# Patient Record
Sex: Female | Born: 1950 | Race: White | Hispanic: No | Marital: Married | Smoking: Never smoker
Health system: Southern US, Community
[De-identification: ages and names within clinical notes are randomized; demographics above are authoritative.]

## PROBLEM LIST (undated history)

## (undated) DIAGNOSIS — I10 Essential (primary) hypertension: Secondary | ICD-10-CM

## (undated) DIAGNOSIS — F32A Depression, unspecified: Secondary | ICD-10-CM

## (undated) DIAGNOSIS — E785 Hyperlipidemia, unspecified: Secondary | ICD-10-CM

## (undated) DIAGNOSIS — J302 Other seasonal allergic rhinitis: Secondary | ICD-10-CM

## (undated) DIAGNOSIS — F329 Major depressive disorder, single episode, unspecified: Secondary | ICD-10-CM

## (undated) DIAGNOSIS — T7840XA Allergy, unspecified, initial encounter: Secondary | ICD-10-CM

## (undated) HISTORY — PX: CHOLECYSTECTOMY: SHX55

## (undated) HISTORY — DX: Allergy, unspecified, initial encounter: T78.40XA

## (undated) HISTORY — PX: VAGINAL HYSTERECTOMY: SUR661

## (undated) HISTORY — PX: RIGHT HEART CATH: CATH118263

## (undated) HISTORY — DX: Depression, unspecified: F32.A

## (undated) HISTORY — PX: AUGMENTATION MAMMAPLASTY: SUR837

## (undated) HISTORY — DX: Major depressive disorder, single episode, unspecified: F32.9

## (undated) HISTORY — DX: Essential (primary) hypertension: I10

## (undated) HISTORY — DX: Other seasonal allergic rhinitis: J30.2

## (undated) HISTORY — DX: Hyperlipidemia, unspecified: E78.5

## (undated) HISTORY — PX: BREAST ENHANCEMENT SURGERY: SHX7

---

## 2014-04-30 ENCOUNTER — Encounter: Payer: Self-pay | Admitting: Internal Medicine

## 2014-04-30 ENCOUNTER — Other Ambulatory Visit: Payer: Self-pay

## 2014-04-30 DIAGNOSIS — Z1231 Encounter for screening mammogram for malignant neoplasm of breast: Secondary | ICD-10-CM

## 2014-05-10 ENCOUNTER — Ambulatory Visit
Admission: RE | Admit: 2014-05-10 | Discharge: 2014-05-10 | Disposition: A | Discharge status: Home / Self Care | Payer: BC Managed Care – PPO | Source: Ambulatory Visit

## 2014-05-10 DIAGNOSIS — Z1231 Encounter for screening mammogram for malignant neoplasm of breast: Secondary | ICD-10-CM

## 2014-05-27 ENCOUNTER — Ambulatory Visit (AMBULATORY_SURGERY_CENTER): Payer: Self-pay | Admitting: *Deleted

## 2014-05-27 VITALS — Ht 68.0 in | Wt 179.0 lb

## 2014-05-27 DIAGNOSIS — Z1211 Encounter for screening for malignant neoplasm of colon: Secondary | ICD-10-CM

## 2014-05-27 MED ORDER — MOVIPREP 100 G PO SOLR: 1.0000 | Freq: Once | ORAL | Status: DC

## 2014-05-27 NOTE — Progress Notes (Signed)
Denies allergies to eggs or soy products. Denies complications with sedation or anesthesia. Denies O2 use. Denies use of diet or weight loss medications.  Emmi instructions given for colonoscopy.  

## 2014-06-03 ENCOUNTER — Encounter: Payer: Self-pay | Admitting: Internal Medicine

## 2014-06-12 ENCOUNTER — Encounter: Payer: Self-pay | Admitting: Internal Medicine

## 2014-06-27 ENCOUNTER — Ambulatory Visit (AMBULATORY_SURGERY_CENTER): Payer: BC Managed Care – PPO | Admitting: Internal Medicine

## 2014-06-27 ENCOUNTER — Encounter: Payer: Self-pay | Admitting: Internal Medicine

## 2014-06-27 VITALS — BP 130/76 | HR 59 | Temp 97.1°F | Resp 16 | Ht 68.0 in | Wt 179.0 lb

## 2014-06-27 DIAGNOSIS — Z1211 Encounter for screening for malignant neoplasm of colon: Secondary | ICD-10-CM

## 2014-06-27 DIAGNOSIS — D126 Benign neoplasm of colon, unspecified: Secondary | ICD-10-CM

## 2014-06-27 MED ORDER — SODIUM CHLORIDE 0.9 % IV SOLN: 500.0000 mL | INTRAVENOUS | Status: DC

## 2014-06-27 NOTE — Patient Instructions (Signed)
YOU HAD AN ENDOSCOPIC PROCEDURE TODAY AT THE [facility]: Refer to the procedure report that was given to you for any specific questions about what was found during the examination.  If the procedure report does not answer your questions, please call your gastroenterologist to clarify.  If you requested that your care partner not be given the details of your procedure findings, then the procedure report has been included in a sealed envelope for you to review at your convenience later.  YOU SHOULD EXPECT: Some feelings of bloating in the abdomen. Passage of more gas than usual.  Walking can help get rid of the air that was put into your GI tract during the procedure and reduce the bloating. If you had a lower endoscopy (such as a colonoscopy or flexible sigmoidoscopy) you may notice spotting of blood in your stool or on the toilet paper. If you underwent a bowel prep for your procedure, then you may not have a normal bowel movement for a few days.  DIET: Your first meal following the procedure should be a light meal and then it is ok to progress to your normal diet.  A half-sandwich or bowl of soup is an example of a good first meal.  Heavy or fried foods are harder to digest and may make you feel nauseous or bloated.  Likewise meals heavy in dairy and vegetables can cause extra gas to form and this can also increase the bloating.  Drink plenty of fluids but you should avoid alcoholic beverages for 24 hours.  ACTIVITY: Your care partner should take you home directly after the procedure.  You should plan to take it easy, moving slowly for the rest of the day.  You can resume normal activity the day after the procedure however you should NOT DRIVE or use heavy machinery for 24 hours (because of the sedation medicines used during the test).    SYMPTOMS TO REPORT IMMEDIATELY: A gastroenterologist can be reached at any hour.  During normal business hours, 8:30 AM to 5:00 PM Monday through Friday,  call [phone].  After hours and on weekends, please call the GI answering service at [phone] who will take a message and have the physician on call contact you.   Following lower endoscopy (colonoscopy or flexible sigmoidoscopy):  Excessive amounts of blood in the stool  Significant tenderness or worsening of abdominal pains  Swelling of the abdomen that is new, acute  Fever of 100F or higher    FOLLOW UP: If any biopsies were taken you will be contacted by phone or by letter within the next 1-3 weeks.  Call your gastroenterologist if you have not heard about the biopsies in 3 weeks.  Our staff will call the home number listed on your records the next business day following your procedure to check on you and address any questions or concerns that you may have at that time regarding the information given to you following your procedure. This is a courtesy call and so if there is no answer at the home number and we have not heard from you through the emergency physician on call, we will assume that you have returned to your regular daily activities without incident.  SIGNATURES/CONFIDENTIALITY: You and/or your care partner have signed paperwork which will be entered into your electronic medical record.  These signatures attest to the fact that that the information above on your After Visit Summary has been reviewed and is understood.  Full responsibility of the confidentiality   of this discharge information lies with you and/or your care-partner.  Polyp, diverticulosis, and high fiber diet information given. 

## 2014-06-27 NOTE — Progress Notes (Signed)
A/ox3 pleased with MAC, report to Jane RN 

## 2014-06-27 NOTE — Progress Notes (Signed)
Called to room to assist during endoscopic procedure.  Patient ID and intended procedure confirmed with present staff. Received instructions for my participation in the procedure from the performing physician.  

## 2014-06-27 NOTE — Op Note (Signed)
[place]  [address]. [place], [id number]   COLONOSCOPY PROCEDURE REPORT  PATIENT: Shannon Haney, Shannon Haney  MR#: [id number] BIRTHDATE: [birth year] , 63  yrs. old GENDER: Female ENDOSCOPIST: Jerene Bears, MD REFERRED UM:PNTIR Ardeth Perfect, M.D. PROCEDURE DATE:  06/27/2014 PROCEDURE:   Colonoscopy with cold biopsy polypectomy First Screening Colonoscopy - Avg.  risk and is 50 yrs.  old or older - No.  Prior Negative Screening - Now for repeat screening. 10 or more years since last screening  History of Adenoma - Now for follow-up colonoscopy & has been > or = to 3 yrs.  N/A  Polyps Removed Today? Yes. ASA CLASS:   Class II INDICATIONS:average risk screening and Last colonoscopy performed 10 years ago. MEDICATIONS: MAC sedation, administered by CRNA and propofol (Diprivan) 200mg  IV  DESCRIPTION OF PROCEDURE:   After the risks benefits and alternatives of the procedure were thoroughly explained, informed consent was obtained.  A digital rectal exam revealed a skin tag. The LB [id number]  endoscope was introduced through the anus and advanced to the terminal ileum which was intubated for a short distance. No adverse events experienced.   The quality of the prep was good, using MoviPrep  The instrument was then slowly withdrawn as the colon was fully examined.   COLON FINDINGS: The mucosa appeared normal in the terminal ileum. A sessile polyp measuring 3-4 mm in size was found at the hepatic flexure.  A polypectomy was performed with cold forceps.  The resection was complete and the polyp tissue was completely retrieved.   Mild diverticulosis was noted in the sigmoid colon. Retroflexed views revealed internal hemorrhoids and hypertrophied anal papilla. The time to cecum=3 minutes 11 seconds.  Withdrawal time=10 minutes 37 seconds.  The scope was withdrawn and the procedure completed. COMPLICATIONS: There were no complications.  ENDOSCOPIC IMPRESSION: 1.   Normal  mucosa in the terminal ileum 2.   Sessile polyp measuring 3-4 mm in size was found at the hepatic flexure; polypectomy was performed with cold forceps 3.   Mild diverticulosis was noted in the sigmoid colon  RECOMMENDATIONS: 1.  Await biopsy results 2.  High fiber diet 3.  If the polyp removed today is proven to be an adenomatous (pre-cancerous) polyp, you will need a repeat colonoscopy in 5 years.  Otherwise you should continue to follow colorectal cancer screening guidelines for "routine risk" patients with colonoscopy in 10 years.  You will receive a letter within 1-2 weeks with the results of your biopsy as well as final recommendations.  Please call my office if you have not received a letter after 3 weeks.  eSigned:  Jerene Bears, MD 06/27/2014 9:08 AM     cc: The Patient; Velna Hatchet, M.D.

## 2014-06-28 ENCOUNTER — Telehealth: Payer: Self-pay | Admitting: *Deleted

## 2014-06-28 NOTE — Telephone Encounter (Signed)
  Follow up Call-  Call back number 06/27/2014  Post procedure Call Back phone  # [phone]  Permission to leave phone message Yes     Patient questions:  Do you have a fever, pain , or abdominal swelling? No. Pain Score  0 *  Have you tolerated food without any problems? Yes.    Have you been able to return to your normal activities? Yes.    Do you have any questions about your discharge instructions: Diet   No. Medications  No. Follow up visit  No.  Do you have questions or concerns about your Care? No.  Actions: * If pain score is 4 or above: No action needed, pain <4.

## 2014-07-05 ENCOUNTER — Encounter: Payer: Self-pay | Admitting: Internal Medicine

## 2014-12-05 ENCOUNTER — Other Ambulatory Visit: Payer: Self-pay | Admitting: Ophthalmology

## 2016-11-25 ENCOUNTER — Other Ambulatory Visit: Payer: Self-pay | Admitting: Obstetrics

## 2016-11-25 DIAGNOSIS — R928 Other abnormal and inconclusive findings on diagnostic imaging of breast: Secondary | ICD-10-CM

## 2016-12-03 ENCOUNTER — Ambulatory Visit
Admission: RE | Admit: 2016-12-03 | Discharge: 2016-12-03 | Disposition: A | Discharge status: Home / Self Care | Payer: Medicare Other | Source: Ambulatory Visit | Attending: Obstetrics | Admitting: Obstetrics

## 2016-12-03 DIAGNOSIS — R928 Other abnormal and inconclusive findings on diagnostic imaging of breast: Secondary | ICD-10-CM

## 2017-05-25 ENCOUNTER — Other Ambulatory Visit: Payer: Self-pay | Admitting: Obstetrics

## 2017-05-25 DIAGNOSIS — N6320 Unspecified lump in the left breast, unspecified quadrant: Secondary | ICD-10-CM

## 2017-06-08 ENCOUNTER — Other Ambulatory Visit: Payer: Medicare Other

## 2017-06-09 ENCOUNTER — Ambulatory Visit
Admission: RE | Admit: 2017-06-09 | Discharge: 2017-06-09 | Disposition: A | Discharge status: Home / Self Care | Payer: Medicare Other | Source: Ambulatory Visit | Attending: Obstetrics | Admitting: Obstetrics

## 2017-06-09 ENCOUNTER — Other Ambulatory Visit: Payer: Self-pay | Admitting: Obstetrics

## 2017-06-09 DIAGNOSIS — N6320 Unspecified lump in the left breast, unspecified quadrant: Secondary | ICD-10-CM

## 2017-06-09 DIAGNOSIS — N63 Unspecified lump in unspecified breast: Secondary | ICD-10-CM

## 2017-12-12 ENCOUNTER — Ambulatory Visit
Admission: RE | Admit: 2017-12-12 | Discharge: 2017-12-12 | Disposition: A | Discharge status: Home / Self Care | Payer: Medicare Other | Source: Ambulatory Visit | Attending: Obstetrics | Admitting: Obstetrics

## 2017-12-12 ENCOUNTER — Other Ambulatory Visit: Payer: Self-pay | Admitting: Obstetrics

## 2017-12-12 DIAGNOSIS — N63 Unspecified lump in unspecified breast: Secondary | ICD-10-CM

## 2017-12-12 DIAGNOSIS — N6320 Unspecified lump in the left breast, unspecified quadrant: Secondary | ICD-10-CM

## 2018-06-20 ENCOUNTER — Other Ambulatory Visit: Payer: Self-pay | Admitting: Family Medicine

## 2018-06-20 DIAGNOSIS — Z8249 Family history of ischemic heart disease and other diseases of the circulatory system: Secondary | ICD-10-CM

## 2018-07-26 ENCOUNTER — Ambulatory Visit
Admission: RE | Admit: 2018-07-26 | Discharge: 2018-07-26 | Disposition: A | Discharge status: Home / Self Care | Payer: No Typology Code available for payment source | Source: Ambulatory Visit | Attending: Family Medicine | Admitting: Family Medicine

## 2018-07-26 DIAGNOSIS — Z8249 Family history of ischemic heart disease and other diseases of the circulatory system: Secondary | ICD-10-CM

## 2019-01-01 ENCOUNTER — Observation Stay (HOSPITAL_BASED_OUTPATIENT_CLINIC_OR_DEPARTMENT_OTHER): Payer: Medicare Other

## 2019-01-01 ENCOUNTER — Observation Stay (HOSPITAL_BASED_OUTPATIENT_CLINIC_OR_DEPARTMENT_OTHER)
Admission: EM | Admit: 2019-01-01 | Discharge: 2019-01-02 | Disposition: A | Discharge status: Home / Self Care | Payer: Medicare Other | Attending: Internal Medicine | Admitting: Internal Medicine

## 2019-01-01 ENCOUNTER — Emergency Department (HOSPITAL_BASED_OUTPATIENT_CLINIC_OR_DEPARTMENT_OTHER): Payer: Medicare Other

## 2019-01-01 ENCOUNTER — Other Ambulatory Visit: Payer: Self-pay

## 2019-01-01 ENCOUNTER — Encounter (HOSPITAL_BASED_OUTPATIENT_CLINIC_OR_DEPARTMENT_OTHER): Payer: Self-pay | Admitting: Emergency Medicine

## 2019-01-01 DIAGNOSIS — Z7982 Long term (current) use of aspirin: Secondary | ICD-10-CM | POA: Diagnosis not present

## 2019-01-01 DIAGNOSIS — Z79899 Other long term (current) drug therapy: Secondary | ICD-10-CM | POA: Insufficient documentation

## 2019-01-01 DIAGNOSIS — E785 Hyperlipidemia, unspecified: Secondary | ICD-10-CM | POA: Diagnosis not present

## 2019-01-01 DIAGNOSIS — F329 Major depressive disorder, single episode, unspecified: Secondary | ICD-10-CM | POA: Diagnosis not present

## 2019-01-01 DIAGNOSIS — F411 Generalized anxiety disorder: Secondary | ICD-10-CM | POA: Diagnosis not present

## 2019-01-01 DIAGNOSIS — R9431 Abnormal electrocardiogram [ECG] [EKG]: Secondary | ICD-10-CM

## 2019-01-01 DIAGNOSIS — R61 Generalized hyperhidrosis: Secondary | ICD-10-CM | POA: Diagnosis not present

## 2019-01-01 DIAGNOSIS — R001 Bradycardia, unspecified: Principal | ICD-10-CM | POA: Diagnosis present

## 2019-01-01 DIAGNOSIS — Z882 Allergy status to sulfonamides status: Secondary | ICD-10-CM | POA: Diagnosis not present

## 2019-01-01 DIAGNOSIS — I10 Essential (primary) hypertension: Secondary | ICD-10-CM | POA: Diagnosis present

## 2019-01-01 DIAGNOSIS — R079 Chest pain, unspecified: Secondary | ICD-10-CM | POA: Insufficient documentation

## 2019-01-01 LAB — CBC WITH DIFFERENTIAL/PLATELET
Abs Immature Granulocytes: 0.01 10*3/uL (ref 0.00–0.07)
BASOS ABS: 0 10*3/uL (ref 0.0–0.1)
Basophils Relative: 0 %
EOS PCT: 3 %
Eosinophils Absolute: 0.2 10*3/uL (ref 0.0–0.5)
HEMATOCRIT: 41.7 % (ref 36.0–46.0)
HEMOGLOBIN: 13.9 g/dL (ref 12.0–15.0)
Immature Granulocytes: 0 %
Lymphocytes Relative: 9 %
Lymphs Abs: 0.7 10*3/uL (ref 0.7–4.0)
MCH: 30.3 pg (ref 26.0–34.0)
MCHC: 33.3 g/dL (ref 30.0–36.0)
MCV: 91 fL (ref 80.0–100.0)
Monocytes Absolute: 0.3 10*3/uL (ref 0.1–1.0)
Monocytes Relative: 4 %
NEUTROS PCT: 84 %
NRBC: 0 % (ref 0.0–0.2)
Neutro Abs: 6.6 10*3/uL (ref 1.7–7.7)
Platelets: 205 10*3/uL (ref 150–400)
RBC: 4.58 MIL/uL (ref 3.87–5.11)
RDW: 12.4 % (ref 11.5–15.5)
WBC: 7.8 10*3/uL (ref 4.0–10.5)

## 2019-01-01 LAB — D-DIMER, QUANTITATIVE: D-Dimer, Quant: <0.27 ug/mL-FEU (ref 0.00–0.50)

## 2019-01-01 LAB — COMPREHENSIVE METABOLIC PANEL
ALT: 26 U/L (ref 0–44)
ANION GAP: 10 (ref 5–15)
AST: 23 U/L (ref 15–41)
Albumin: 4.3 g/dL (ref 3.5–5.0)
Alkaline Phosphatase: 61 U/L (ref 38–126)
BUN: 23 mg/dL (ref 8–23)
CHLORIDE: 105 mmol/L (ref 98–111)
CO2: 19 mmol/L — ABNORMAL LOW (ref 22–32)
Calcium: 8.7 mg/dL — ABNORMAL LOW (ref 8.9–10.3)
Creatinine, Ser: 0.74 mg/dL (ref 0.44–1.00)
GFR calc Af Amer: >60 mL/min (ref >60)
Glucose, Bld: 122 mg/dL — ABNORMAL HIGH (ref 70–99)
POTASSIUM: 3.8 mmol/L (ref 3.5–5.1)
Sodium: 134 mmol/L — ABNORMAL LOW (ref 135–145)
Total Bilirubin: 0.6 mg/dL (ref 0.3–1.2)
Total Protein: 7.3 g/dL (ref 6.5–8.1)

## 2019-01-01 LAB — TROPONIN I: Troponin I: <0.03 ng/mL (ref <0.03)

## 2019-01-01 LAB — ECHOCARDIOGRAM COMPLETE
Height: 68 in
WEIGHTICAEL: 2839.52 [oz_av]

## 2019-01-01 LAB — BRAIN NATRIURETIC PEPTIDE: B NATRIURETIC PEPTIDE 5: 29.8 pg/mL (ref 0.0–100.0)

## 2019-01-01 MED ORDER — DIPHENHYDRAMINE HCL 25 MG PO CAPS
25.0000 mg | ORAL_CAPSULE | Freq: Every evening | ORAL | Status: DC | PRN
  Filled 2019-01-01: qty 1

## 2019-01-01 MED ORDER — SIMVASTATIN 20 MG PO TABS
20.0000 mg | ORAL_TABLET | Freq: Every day | ORAL | Status: DC
  Administered 2019-01-01: 20 mg via ORAL
  Filled 2019-01-01: qty 1

## 2019-01-01 MED ORDER — DIPHENHYDRAMINE-APAP (SLEEP) 25-500 MG PO TABS: 1.0000 | ORAL_TABLET | Freq: Every evening | ORAL | Status: DC | PRN

## 2019-01-01 MED ORDER — ENOXAPARIN SODIUM 40 MG/0.4ML ~~LOC~~ SOLN
40.0000 mg | SUBCUTANEOUS | Status: DC
  Administered 2019-01-01: 40 mg via SUBCUTANEOUS
  Filled 2019-01-01: qty 0.4

## 2019-01-01 MED ORDER — NITROGLYCERIN 0.4 MG SL SUBL
0.4000 mg | SUBLINGUAL_TABLET | SUBLINGUAL | Status: DC | PRN
  Administered 2019-01-01: 0.4 mg via SUBLINGUAL
  Filled 2019-01-01: qty 1

## 2019-01-01 MED ORDER — ASPIRIN EC 81 MG PO TBEC
81.0000 mg | DELAYED_RELEASE_TABLET | Freq: Every day | ORAL | Status: DC
  Administered 2019-01-02: 81 mg via ORAL
  Filled 2019-01-01: qty 1

## 2019-01-01 MED ORDER — ACETAMINOPHEN 500 MG PO TABS: 500.0000 mg | ORAL_TABLET | Freq: Every evening | ORAL | Status: DC | PRN

## 2019-01-01 MED ORDER — ACETAMINOPHEN 325 MG PO TABS
650.0000 mg | ORAL_TABLET | Freq: Four times a day (QID) | ORAL | Status: DC | PRN
  Administered 2019-01-01 (×2): 650 mg via ORAL
  Filled 2019-01-01 (×2): qty 2

## 2019-01-01 MED ORDER — ONDANSETRON HCL 4 MG/2ML IJ SOLN: 4.0000 mg | Freq: Four times a day (QID) | INTRAMUSCULAR | Status: DC | PRN

## 2019-01-01 MED ORDER — ASPIRIN 81 MG PO CHEW
324.0000 mg | CHEWABLE_TABLET | Freq: Once | ORAL | Status: AC
  Administered 2019-01-01: 324 mg via ORAL
  Filled 2019-01-01: qty 4

## 2019-01-01 MED ORDER — SODIUM CHLORIDE 0.9 % IV BOLUS
1000.0000 mL | Freq: Once | INTRAVENOUS | Status: AC
  Administered 2019-01-01: 1000 mL via INTRAVENOUS

## 2019-01-01 MED ORDER — ONDANSETRON HCL 4 MG/2ML IJ SOLN
4.0000 mg | Freq: Once | INTRAMUSCULAR | Status: AC
  Administered 2019-01-01: 4 mg via INTRAVENOUS
  Filled 2019-01-01: qty 2

## 2019-01-01 MED ORDER — ESCITALOPRAM OXALATE 10 MG PO TABS
10.0000 mg | ORAL_TABLET | Freq: Every day | ORAL | Status: DC
  Administered 2019-01-01: 10 mg via ORAL
  Filled 2019-01-01 (×2): qty 1

## 2019-01-01 MED ORDER — LISINOPRIL 20 MG PO TABS
20.0000 mg | ORAL_TABLET | Freq: Every day | ORAL | Status: DC
  Administered 2019-01-01: 20 mg via ORAL
  Filled 2019-01-01 (×2): qty 1

## 2019-01-01 MED ORDER — MORPHINE SULFATE (PF) 2 MG/ML IV SOLN: 2.0000 mg | INTRAVENOUS | Status: DC | PRN

## 2019-01-01 NOTE — ED Notes (Signed)
Pt became very diaphoretic, nauseated and pale. HR dropped to 48-50. EDP to bedside for eval.

## 2019-01-01 NOTE — Progress Notes (Signed)
  Echocardiogram 2D Echocardiogram has been performed.  Shannon Haney 01/01/2019, 4:16 PM

## 2019-01-01 NOTE — Progress Notes (Signed)
Admitted from [facility] via EMS.  Patient alert oriented x4. Room air. Denies chest pain, denies dizziness. Vitals stable. Complains of discomfort in right arm and neck. Given tylenol. Oriented to room, family at bedside.

## 2019-01-01 NOTE — H&P (Signed)
History and Physical    Shannon Haney NGE:[id number] DOB: 05-10-1951 DOA: 01/01/2019  PCP: Velna Hatchet, MD  Patient coming from: home   I have personally briefly reviewed patient's old medical records available.   Chief Complaint: 3 brief episodes of nausea, diaphoresis, 1 week of right arm pain.   HPI: Shannon Haney is a 68 y.o. female with medical history significant of hypertension, hyperlipidemia, generalized anxiety disorder presenting to the emergency room today when she had total 3 episodes of transient symptoms including nausea, diaphoresis and dizziness.  According to the patient, she was having dull, mild, pain all over her right arm for about 1 week and was taking some Tylenol.  This was continues with no aggravating or relieving factors.  Today morning at about 4 AM she woke up with shortness of breath, nausea and diaphoresisPatient had another episode at about 630 when she went to emergency room.  The pain symptoms on her right arm were mostly on the right side.  No radiation to the chest, back neck or jaw.  No history of angina. 2 episodes happened at home, she was symptom-free when he went to ER, in the emergency room she had one episode where she was on monitor, she was noted to be bradycardic heart rate as low as 48 on telemetry.  Improved spontaneously in 2 minutes.  IV fluids and Zofran were given.  Bradycardia was reportedly looked like a vagal event when patient was feeling near syncopal and dizzy. Patient denies any fever, cough, wheezing, angina, abdominal pain.  Denies any recent flulike symptoms.  Denies any trauma. ED Course: Hemodynamically stable.  Bradycardic at 48 at the time of event.  2 sets of troponins negative.  Twelve-lead EKG shows nonischemic.  Low voltage EKG with no ST-T wave changes.  She was given aspirin nitroglycerin along with IV fluids and Zofran in the ER.  Review of Systems: As per HPI otherwise 10 point review of systems negative.    Past  Medical History:  Diagnosis Date  . Depression   . Hyperlipidemia   . Hypertension   . Seasonal allergies     Past Surgical History:  Procedure Laterality Date  . BREAST ENHANCEMENT SURGERY    . CHOLECYSTECTOMY    . VAGINAL HYSTERECTOMY       reports that she has never smoked. She has never used smokeless tobacco. She reports current alcohol use of about 5.0 standard drinks of alcohol per week. She reports that she does not use drugs.  Allergies  Allergen Reactions  . Sulfa Antibiotics Rash    Fever, joint aches    Family History  Problem Relation Age of Onset  . Prostate cancer Father   . Colon cancer Neg Hx   . Esophageal cancer Neg Hx   . Rectal cancer Neg Hx   . Stomach cancer Neg Hx      Prior to Admission medications   Medication Sig Start Date End Date Taking? Authorizing Provider  Acetaminophen (TYLENOL EXTRA STRENGTH PO) Take 1,000 mg by mouth every 8 (eight) hours as needed (pain).    Yes [provider]  diphenhydramine-acetaminophen (TYLENOL PM) 25-500 MG TABS Take 1 tablet by mouth at bedtime as needed (sleep).    Yes [provider]  escitalopram (LEXAPRO) 10 MG tablet Take 10 mg by mouth daily.   Yes [provider]  lisinopril (PRINIVIL,ZESTRIL) 20 MG tablet Take 20 mg by mouth daily.    Yes [provider]  simvastatin (ZOCOR) 20 MG tablet  Take 20 mg by mouth daily at 6 PM.    Yes [provider]    Physical Exam: Vitals:   01/01/19 1100 01/01/19 1254 01/01/19 1301 01/01/19 1305  BP: 112/73  131/79   Pulse: 70  72   Resp: 17  17   Temp:    97.6 F (36.4 C)  TempSrc:      SpO2: 96%  98%   Weight:  80.5 kg    Height:  5\' 8"  (1.727 m)      Constitutional: NAD, calm, comfortable Vitals:   01/01/19 1100 01/01/19 1254 01/01/19 1301 01/01/19 1305  BP: 112/73  131/79   Pulse: 70  72   Resp: 17  17   Temp:    97.6 F (36.4 C)  TempSrc:      SpO2: 96%  98%   Weight:  80.5 kg    Height:  5\' 8"  (1.727  m)     Eyes: PERRL, lids and conjunctivae normal ENMT: Mucous membranes are moist. Posterior pharynx clear of any exudate or lesions.Normal dentition.  Neck: normal, supple, no masses, no thyromegaly Respiratory: clear to auscultation bilaterally, no wheezing, no crackles. Normal respiratory effort. No accessory muscle use.  Cardiovascular: Regular rate and rhythm, no murmurs / rubs / gallops. No extremity edema. 2+ pedal pulses. No carotid bruits.  Abdomen: no tenderness, no masses palpated. No hepatosplenomegaly. Bowel sounds positive.  Musculoskeletal: no clubbing / cyanosis. No joint deformity upper and lower extremities. Good ROM, no contractures. Normal muscle tone.  Skin: no rashes, lesions, ulcers. No induration Neurologic: CN 2-12 grossly intact. Sensation intact, DTR normal. Strength 5/5 in all 4.  Psychiatric: Normal judgment and insight. Alert and oriented x 3. Normal mood.   She does not have any reproducible tenderness.  Right arm examination does not reveal any palpable tenderness, joint tenderness or limitation of range of motion.  Labs on Admission: I have personally reviewed following labs and imaging studies  CBC: Recent Labs  Lab 01/01/19 0824  WBC 7.8  NEUTROABS 6.6  HGB 13.9  HCT 41.7  MCV 91.0  PLT 009   Basic Metabolic Panel: Recent Labs  Lab 01/01/19 0824  NA 134*  K 3.8  CL 105  CO2 19*  GLUCOSE 122*  BUN 23  CREATININE 0.74  CALCIUM 8.7*   GFR: Estimated Creatinine Clearance: 73.9 mL/min (by C-G formula based on SCr of 0.74 mg/dL). Liver Function Tests: Recent Labs  Lab 01/01/19 0824  AST 23  ALT 26  ALKPHOS 61  BILITOT 0.6  PROT 7.3  ALBUMIN 4.3   No results for input(s): LIPASE, AMYLASE in the last 168 hours. No results for input(s): AMMONIA in the last 168 hours. Coagulation Profile: No results for input(s): INR, PROTIME in the last 168 hours. Cardiac Enzymes: Recent Labs  Lab 01/01/19 0824 01/01/19 1149  TROPONINI <0.03  <0.03   BNP (last 3 results) No results for input(s): PROBNP in the last 8760 hours. HbA1C: No results for input(s): HGBA1C in the last 72 hours. CBG: No results for input(s): GLUCAP in the last 168 hours. Lipid Profile: No results for input(s): CHOL, HDL, LDLCALC, TRIG, CHOLHDL, LDLDIRECT in the last 72 hours. Thyroid Function Tests: No results for input(s): TSH, T4TOTAL, FREET4, T3FREE, THYROIDAB in the last 72 hours. Anemia Panel: No results for input(s): VITAMINB12, FOLATE, FERRITIN, TIBC, IRON, RETICCTPCT in the last 72 hours. Urine analysis: No results found for: COLORURINE, APPEARANCEUR, [place], [place], [place], [place], [place], [place], [place], [place], [place], [place]  Exams on Admission: Dg Chest 2 View  Result Date: 01/01/2019 CLINICAL DATA:  Shortness of breath.  Hypertension. EXAM: CHEST - 2 VIEW COMPARISON:  None. FINDINGS: No edema or consolidation. The heart size and pulmonary vascularity are normal. No adenopathy. No bone lesions. IMPRESSION: No edema or consolidation. Electronically Signed   By: Lowella Grip III M.D.   On: 01/01/2019 08:30    EKG: Independently reviewed.  Normal sinus rhythm.  Low voltage.  No ST-T wave changes.  Assessment/Plan Principal Problem:   Diaphoresis Active Problems:   Essential hypertension   Hyperlipidemia     1.  Transient multiple episodes of nausea, diaphoresis and bradycardia along with left-sided arm pain: Currently symptom-free and hemodynamically stable. We will admit patient to the telemetry unit given severity of symptoms.  Will monitor in telemetry to catch any arrhythmias if she has any recurrent symptoms. Cycle EKG and troponins to rule out acute coronary syndrome. Supplemental oxygen to keep saturations more than 90%. Aspirin 81 mg daily.  Patient is already on simvastatin that she will continue. Will check echocardiogram to evaluate any valve abnormalities and also to look for  ejection fraction. If recurrent episode and bradycardia, will call cardiology evaluation.  2.  Hypertension: Fairly stable.  Not on any rate control medications.  She will continue lisinopril.  3.  Hyperlipidemia: On a statin and well-tolerated.  4.  Anxiety disorder: On Lexapro that she will continue.   DVT prophylaxis: Lovenox Code Status: Full code Family Communication: Husband at the bedside.  Daughter at the bedside who is a pediatrician. Disposition Plan: Home when is stable. Consults called: None. Admission status: Observation.  Cardiac telemetry.   Barb Merino MD [facility] Pager [phone]  If 7PM-7AM, please contact night-coverage [email or web address] Password [facility]  01/01/2019, 3:16 PM

## 2019-01-01 NOTE — ED Notes (Signed)
Pt placed on zoll pads 

## 2019-01-01 NOTE — ED Triage Notes (Signed)
Pt states she woke up at 4 am with nausea, SOB and diaphoresis. Also reports R arm pain for 1 week.

## 2019-01-01 NOTE — ED Provider Notes (Signed)
Emergency Department Provider Note   I have reviewed the triage vital signs and the nursing notes.   HISTORY  Chief Complaint Shortness of Breath   HPI Shannon Haney is a 68 y.o. female with PMH of HTN, HLD, and depression presents to the emergency department for evaluation of acute onset shortness of breath, nausea, diaphoresis which began at approximately 4 AM this morning.  Patient has had constant, right upper arm soreness and tightness over the past week.  No radiation to the chest, back, neck, jaw.  No worsening arm pain with movement, exertion, or other symptoms.  Patient did not notice worsening arm pain during her symptoms this morning.  She states that the shortness of breath, diaphoresis lasted for several minutes and then resolved.  She had an additional episode later in the morning which also resolved after several minutes. She is currently symptom-free.   Past Medical History:  Diagnosis Date  . Depression   . Hyperlipidemia   . Hypertension   . Seasonal allergies     Patient Active Problem List   Diagnosis Date Noted  . Chest pain 01/01/2019    Past Surgical History:  Procedure Laterality Date  . BREAST ENHANCEMENT SURGERY    . CHOLECYSTECTOMY    . VAGINAL HYSTERECTOMY     Allergies Sulfa antibiotics  Family History  Problem Relation Age of Onset  . Prostate cancer Father   . Colon cancer Neg Hx   . Esophageal cancer Neg Hx   . Rectal cancer Neg Hx   . Stomach cancer Neg Hx     Social History Social History   Tobacco Use  . Smoking status: Never Smoker  . Smokeless tobacco: Never Used  Substance Use Topics  . Alcohol use: Yes    Alcohol/week: 5.0 standard drinks    Types: 3 Glasses of wine, 2 Standard drinks or equivalent per week  . Drug use: No    Review of Systems  Constitutional: No fever/chills. Positive diaphoresis.  Eyes: No visual changes. ENT: No sore throat. Cardiovascular: Negative chest pain. Respiratory: Positive  shortness of breath. Gastrointestinal: No abdominal pain. Positive nausea, no vomiting.  No diarrhea.  No constipation. Genitourinary: Negative for dysuria. Positive right arm pain.  Musculoskeletal: Negative for back pain. Skin: Negative for rash. Neurological: Negative for headaches, focal weakness or numbness.  10-point ROS otherwise negative.  ____________________________________________   PHYSICAL EXAM:  VITAL SIGNS: ED Triage Vitals  Enc Vitals Group     BP 01/01/19 0812 (!) 147/82     Pulse Rate 01/01/19 0812 73     Resp 01/01/19 0812 16     Temp 01/01/19 0812 (!) 97.4 F (36.3 C)     Temp Source 01/01/19 0812 Oral     SpO2 01/01/19 0812 99 %     Weight 01/01/19 0811 175 lb (79.4 kg)     Height 01/01/19 0811 5\' 8"  (1.727 m)     Pain Score 01/01/19 0811 5   Constitutional: Alert and oriented. Well appearing and in no acute distress. Eyes: Conjunctivae are normal.  Head: Atraumatic. Nose: No congestion/rhinnorhea. Mouth/Throat: Mucous membranes are moist. Neck: No stridor.   Cardiovascular: Normal rate, regular rhythm. Good peripheral circulation. Grossly normal heart sounds.   Respiratory: Normal respiratory effort.  No retractions. Lungs CTAB. Gastrointestinal: Soft and nontender. No distention.  Musculoskeletal: No lower extremity tenderness nor edema. No gross deformities of extremities. Neurologic:  Normal speech and language. No gross focal neurologic deficits are appreciated.  Skin:  Skin is  warm, dry and intact. No rash noted.  ____________________________________________   LABS (all labs ordered are listed, but only abnormal results are displayed)  Labs Reviewed  COMPREHENSIVE METABOLIC PANEL - Abnormal; Notable for the following components:      Result Value   Sodium 134 (*)    CO2 19 (*)    Glucose, Bld 122 (*)    Calcium 8.7 (*)    All other components within normal limits  BRAIN NATRIURETIC PEPTIDE  TROPONIN I  CBC WITH DIFFERENTIAL/PLATELET    D-DIMER, QUANTITATIVE (NOT AT [facility])   ____________________________________________  EKG   EKG Interpretation  Date/Time:  Monday January 01 2019 08:16:53 EDT Ventricular Rate:  73 PR Interval:    QRS Duration: 106 QT Interval:  421 QTC Calculation: 464 R Axis:   1 Text Interpretation:  Sinus rhythm Low voltage, extremity and precordial leads No STEMI.  Confirmed by Nanda Quinton [phone]) on 01/01/2019 8:31:54 AM       ____________________________________________  RADIOLOGY  Dg Chest 2 View  Result Date: 01/01/2019 CLINICAL DATA:  Shortness of breath.  Hypertension. EXAM: CHEST - 2 VIEW COMPARISON:  None. FINDINGS: No edema or consolidation. The heart size and pulmonary vascularity are normal. No adenopathy. No bone lesions. IMPRESSION: No edema or consolidation. Electronically Signed   By: Lowella Grip III M.D.   On: 01/01/2019 08:30    ____________________________________________   PROCEDURES  Procedure(s) performed:   Procedures  CRITICAL CARE Performed by: Margette Fast Total critical care time: 35 minutes Critical care time was exclusive of separately billable procedures and treating other patients. Critical care was necessary to treat or prevent imminent or life-threatening deterioration. Critical care was time spent personally by me on the following activities: development of treatment plan with patient and/or surrogate as well as nursing, discussions with consultants, evaluation of patient's response to treatment, examination of patient, obtaining history from patient or surrogate, ordering and performing treatments and interventions, ordering and review of laboratory studies, ordering and review of radiographic studies, pulse oximetry and re-evaluation of patient's condition.  Nanda Quinton, MD Emergency Medicine  ____________________________________________   INITIAL IMPRESSION / ASSESSMENT AND PLAN / ED COURSE  Pertinent labs & imaging results that were  available during my care of the patient were reviewed by me and considered in my medical decision making (see chart for details).  Patient presents to the emergency department with acute onset shortness of breath with nausea and diaphoresis.  She has had some associated right arm pain over the past week but this did not get worse during the episode.  No chest pain.  I am still considering acute coronary syndrome is a possibility especially given that symptoms can be atypical in women.  Plan for troponin, screening labs, chest x-ray.  Also added d-dimer with acute onset shortness of breath symptoms.  Vital signs are normal.  Patient asymptomatic at this time.  EKG with no acute ischemic findings.  Labs are largely unremarkable.  D-dimer and troponin are both negative.  Chest x-ray reviewed with no infiltrate.  I was called to the patient room as she became acutely symptomatic with shortness of breath and diaphoresis.  She was bradycardic to the 40s with blood pressure in the high 80s.  This resolved spontaneously after 1 to 2 minutes.  I started IV fluids and gave Zofran.  Patient appears to be having primarily a vagal response.  Did place pacing pads on the patient as a precaution but did not have to externally pace.  Plan for observational admission given episodic bradycardia with symptoms.  Discussed patient's case with Hospitalist to request admission. Patient and family (if present) updated with plan. Care transferred to Hospitalist service.  I reviewed all nursing notes, vitals, pertinent old records, EKGs, labs, imaging (as available).  ____________________________________________  FINAL CLINICAL IMPRESSION(S) / ED DIAGNOSES  Final diagnoses:  Symptomatic bradycardia    MEDICATIONS GIVEN DURING THIS VISIT:  Medications  nitroGLYCERIN (NITROSTAT) SL tablet 0.4 mg (0.4 mg Sublingual Given 01/01/19 0830)  aspirin chewable tablet 324 mg (324 mg Oral Given 01/01/19 0829)  ondansetron (ZOFRAN)  injection 4 mg (4 mg Intravenous Given 01/01/19 0845)  sodium chloride 0.9 % bolus 1,000 mL (0 mLs Intravenous Stopped 01/01/19 0943)   Note:  This document was prepared using Dragon voice recognition software and may include unintentional dictation errors.  Nanda Quinton, MD Emergency Medicine    Long, [facility], MD 01/01/19 [phone]

## 2019-01-02 ENCOUNTER — Other Ambulatory Visit: Payer: Self-pay | Admitting: Cardiology

## 2019-01-02 DIAGNOSIS — I10 Essential (primary) hypertension: Secondary | ICD-10-CM

## 2019-01-02 DIAGNOSIS — R001 Bradycardia, unspecified: Secondary | ICD-10-CM | POA: Diagnosis not present

## 2019-01-02 DIAGNOSIS — E785 Hyperlipidemia, unspecified: Secondary | ICD-10-CM

## 2019-01-02 DIAGNOSIS — R61 Generalized hyperhidrosis: Secondary | ICD-10-CM | POA: Diagnosis not present

## 2019-01-02 LAB — BASIC METABOLIC PANEL
Anion gap: 6 (ref 5–15)
BUN: 13 mg/dL (ref 8–23)
CALCIUM: 8.5 mg/dL — AB (ref 8.9–10.3)
CO2: 22 mmol/L (ref 22–32)
Chloride: 110 mmol/L (ref 98–111)
Creatinine, Ser: 0.69 mg/dL (ref 0.44–1.00)
GFR calc Af Amer: >60 mL/min (ref >60)
GFR calc non Af Amer: >60 mL/min (ref >60)
Glucose, Bld: 110 mg/dL — ABNORMAL HIGH (ref 70–99)
Potassium: 3.8 mmol/L (ref 3.5–5.1)
Sodium: 138 mmol/L (ref 135–145)

## 2019-01-02 LAB — LIPID PANEL
Cholesterol: 154 mg/dL (ref 0–200)
HDL: 37 mg/dL — ABNORMAL LOW (ref >40)
LDL Cholesterol: 80 mg/dL (ref 0–99)
Total CHOL/HDL Ratio: 4.2 RATIO
Triglycerides: 186 mg/dL — ABNORMAL HIGH (ref <150)
VLDL: 37 mg/dL (ref 0–40)

## 2019-01-02 LAB — TSH: TSH: 2.493 u[IU]/mL (ref 0.350–4.500)

## 2019-01-02 LAB — TROPONIN I: Troponin I: <0.03 ng/mL (ref <0.03)

## 2019-01-02 LAB — MAGNESIUM: Magnesium: 2.1 mg/dL (ref 1.7–2.4)

## 2019-01-02 MED ORDER — CALCIUM GLUCONATE-NACL 1-0.675 GM/50ML-% IV SOLN
1.0000 g | Freq: Once | INTRAVENOUS | Status: DC
  Filled 2019-01-02: qty 50

## 2019-01-02 MED ORDER — ASPIRIN 81 MG PO TBEC: 81.0000 mg | DELAYED_RELEASE_TABLET | Freq: Every day | ORAL | Status: DC

## 2019-01-02 NOTE — Care Management Obs Status (Signed)
[facility] NOTIFICATION   Patient Details  Name: Shannon Haney MRN: [id number] Date of Birth: 09-27-1951   Medicare Observation Status Notification Given:  Yes    Midge Minium RN, BSN, NCM-BC, ACM-RN [phone] 01/02/2019, 3:26 PM

## 2019-01-02 NOTE — Discharge Summary (Addendum)
Shannon Haney, is a 68 y.o. female  DOB 04-15-1951  MRN [id number].  Admission date:  01/01/2019  Admitting Physician  Barb Merino, MD  Discharge Date:  01/02/2019   Primary MD  Velna Hatchet, MD  Recommendations for primary care physician for things to follow:     Discharge Diagnosis    Principal Problem:   Vagal bradycardia Active Problems:   Essential hypertension   Dyslipidemia   Diaphoresis   Hypocalcemia      Past Medical History:  Diagnosis Date  . Depression   . Hyperlipidemia   . Hypertension   . Seasonal allergies     Past Surgical History:  Procedure Laterality Date  . BREAST ENHANCEMENT SURGERY    . CHOLECYSTECTOMY    . VAGINAL HYSTERECTOMY         HPI  from the history and physical done on the day of admission:  Shannon Haney is a 68 y.o. female with medical history significant of hypertension, hyperlipidemia, generalized anxiety disorder presenting to the emergency room today when she had total 3 episodes of transient symptoms including nausea, diaphoresis and dizziness.  According to the patient, she was having dull, mild, pain all over her right arm for about 1 week and was taking some Tylenol.  This was continues with no aggravating or relieving factors.  Today morning at about 4 AM she woke up with shortness of breath, nausea and diaphoresisPatient had another episode at about 630 when she went to emergency room.  The pain symptoms on her right arm were mostly on the right side.  No radiation to the chest, back neck or jaw.  No history of angina. 2 episodes happened at home, she was symptom-free when he went to ER, in the emergency room she had one episode where she was on monitor, she was noted to be bradycardic heart rate as low as 48 on telemetry.  Improved spontaneously in 2 minutes.  IV fluids and Zofran were given.  Bradycardia was reportedly looked like  a vagal event when patient was feeling near syncopal and dizzy. Patient denies any fever, cough, wheezing, angina, abdominal pain.  Denies any recent flulike symptoms.  Denies any trauma.  ED Course: Hemodynamically stable.  Bradycardic at 48 at the time of event.  2 sets of troponins negative.  Twelve-lead EKG shows nonischemic.  Low voltage EKG with no ST-T wave changes.  She was given aspirin nitroglycerin along with IV fluids and Zofran in the ER.       Hospital Course:    1.  Transient multiple episodes of nausea, diaphoresis, and bradycardia along with left-sided arm pain: Currently symptom-free and hemodynamically stable.on admission EKG noted normal sinus rhythm with no significant abnormalities.  Cardiac troponins have been negative. Echocardiogram her EF was 60 to 65% with impaired relaxation, but no significant wall motion abnormalities noted.In the ED patient was bradycardic to the 40s with blood pressure in the high 80s that spontaneous resolvved in 1-2 minutes. Telemetry  overnight did note patient having a missed beat.  TSH was within normal limits at 2.493.  Cardiology consulted and will set patient up with outpatient 2-week ZIO patch monitor and follow-up.  Suspect symptoms may be vagal mediated.  Patient was recommended to continue on 81 mg aspirin.   2.  Hypertension: Stable.  Not on any rate control medications.  She will continue lisinopril.  3.   Dyslipidemia: Patient continued on simvastatin 20 mg daily.  Lipid panel revealed total cholesterol 154, triglycerides 186, HDL 37, and LDL 80.  Patient to follow-up with primary care provider for additional  4.  Anxiety disorder: On Lexapro that she will continue.                                                                          5.  Hypocalcemia: Calcium levels noted to be mildly low at 8.6 even when corrected for albumin.  Patient was advised to eat calcium rich foods and follow-up with primary care provider for repeat  check.  Follow UP      Consults obtained: Cardiology Dr. Debara Pickett  Discharge Condition: Stable  Diet and Activity recommendation: See Discharge Instructions below   Discharge Instructions    Discharge instructions   Complete by:  As directed    Follow with Primary MD Velna Hatchet, MD within 1 to 2 weeks.  There was no significant concern for blockage to the arteries supplying heart based off blood work and echocardiogram performed.  We evaluated other possible causes such as thyroid problems, but thyroid studies were noted to be within normal limits.  It was found that your calcium levels were mildly low, and can likely be supplemented with foods rich in calcium such as fruits.  Please follow-up with your primary care provider as you may need to be on some form of replacement.  You will be set up with a Holter monitor through cardiology to evaluate your heart rhythm.  They will call you and let you know when the Holter monitor is available to be picked up.  If you have persistent symptoms do not be afraid to come back to the hospital for further evaluation.  Get BMP and lipid panel-  checked  by Primary MD or SNF MD in 5-7 days ( we routinely change or add medications that can affect your baseline labs and fluid status, therefore we recommend that you get the mentioned basic workup next visit with your PCP, your PCP may decide not to get them or add new tests based on their clinical decision)  Activity: As tolerated   Disposition: Home   Diet: Heart Healthy   Special Instructions: If you have smoked or chewed Tobacco  in the last 2 yrs please stop smoking, stop any regular Alcohol  and or any Recreational drug use.  On your next visit with your primary care physician please Get Medicines reviewed and adjusted.  Please request your Velna Hatchet, MD to go over all Hospital Tests and Procedure/Radiological results at the follow up, please get all Hospital records sent to your Prim MD  by signing hospital release before you go home.  If you experience worsening of your admission symptoms, develop shortness of breath, life threatening emergency, suicidal  or homicidal thoughts you must seek medical attention immediately by calling 911 or calling your MD immediately  if symptoms less severe.  You Must read complete instructions/literature along with all the possible adverse reactions/side effects for all the Medicines you take and that have been prescribed to you. Take any new Medicines after you have completely understood and accpet all the possible adverse reactions/side effects.   Do not drive, operate heavy machinery, perform activities at heights, swimming or participation in water activities or provide baby sitting services if your were admitted for syncope or siezures until you have seen by Primary MD or a Neurologist and advised to do so again.  Do not drive when taking Pain medications.  Do not take more than prescribed Pain, Sleep and Anxiety Medications  Wear Seat belts while driving.   Please note  You were cared for by a hospitalist during your hospital stay. If you have any questions about your discharge medications or the care you received while you were in the hospital after you are discharged, you can call the unit and asked to speak with the hospitalist on call if the hospitalist that took care of you is not available. Once you are discharged, your primary care physician will handle any further medical issues. Please note that NO REFILLS for any discharge medications will be authorized once you are discharged, as it is imperative that you return to your primary care physician (or establish a relationship with a primary care physician if you do not have one) for your aftercare needs so that they can reassess your need for medications and monitor your lab values.        Discharge Medications     Allergies as of 01/02/2019      Reactions   Sulfa Antibiotics Rash    Fever, joint aches      Medication List    TAKE these medications   aspirin 81 MG EC tablet Take 1 tablet (81 mg total) by mouth daily. Start taking on:  January 03, 2019   diphenhydramine-acetaminophen 25-500 MG Tabs tablet Commonly known as:  TYLENOL PM Take 1 tablet by mouth at bedtime as needed (sleep).   escitalopram 10 MG tablet Commonly known as:  LEXAPRO Take 10 mg by mouth daily.   lisinopril 20 MG tablet Commonly known as:  PRINIVIL,ZESTRIL Take 20 mg by mouth daily.   simvastatin 20 MG tablet Commonly known as:  ZOCOR Take 20 mg by mouth daily at 6 PM.   TYLENOL EXTRA STRENGTH PO Take 1,000 mg by mouth every 8 (eight) hours as needed (pain).       Major procedures and Radiology Reports - PLEASE review detailed and final reports for all details, in brief -   Echocardiogram 01/01/2019: Impression  The left ventricle has normal systolic function with an ejection fraction of 60-65%. The cavity size was normal. Left ventricular diastolic Doppler parameters are consistent with impaired relaxation. No evidence of left ventricular regional wall  motion abnormalities.  2. The right ventricle has normal systolic function. The cavity was normal. There is no increase in right ventricular wall thickness.  3. The aortic root and ascending aorta are normal in size and structure.  4. The interatrial septum was not well visualized.  Dg Chest 2 View  Result Date: 01/01/2019 CLINICAL DATA:  Shortness of breath.  Hypertension. EXAM: CHEST - 2 VIEW COMPARISON:  None. FINDINGS: No edema or consolidation. The heart size and pulmonary vascularity are normal. No adenopathy. No bone lesions.  IMPRESSION: No edema or consolidation. Electronically Signed   By: Lowella Grip III M.D.   On: 01/01/2019 08:30    Micro Results     No results found for this or any previous visit (from the past 240 hour(s)).     Today   Subjective    Shannon Haney today patient reported that  she had no issues overnight.  However, yesterday she had 3 episodes with heart rates in the 40s, nausea, diaphoresis, dizziness, and arm pain.  1 of the episodes was appreciated on telemetry while she was in the emergency department.   Objective   Blood pressure 112/64, pulse 66, temperature 98.4 F (36.9 C), temperature source Oral, resp. rate 18, height 5\' 8"  (1.727 m), weight 80.5 kg, SpO2 97 %.   Intake/Output Summary (Last 24 hours) at 01/02/2019 1515 Last data filed at 01/02/2019 1247 Gross per 24 hour  Intake 480 ml  Output -  Net 480 ml    Exam  Constitutional: Elderly female in NAD, calm, comfortable Eyes: PERRL, lids and conjunctivae normal ENMT: Mucous membranes are moist. Posterior pharynx clear of any exudate or lesions.Normal dentition.  Neck: normal, supple, no masses, no thyromegaly Respiratory: clear to auscultation bilaterally, no wheezing, no crackles. Normal respiratory effort. No accessory muscle use.  Cardiovascular: Regular rate and rhythm, no murmurs / rubs / gallops. No extremity edema. 2+ pedal pulses. No carotid bruits.  Abdomen: no tenderness, no masses palpated. No hepatosplenomegaly. Bowel sounds positive.  Musculoskeletal: no clubbing / cyanosis. No joint deformity upper and lower extremities. Good ROM, no contractures. Normal muscle tone.  Skin: no rashes, lesions, ulcers. No induration Neurologic: CN 2-12 grossly intact. Sensation intact, DTR normal. Strength 5/5 in all 4.  Psychiatric: Normal judgment and insight. Alert and oriented x 3. Normal mood.    Data Review   CBC w Diff:  Lab Results  Component Value Date   WBC 7.8 01/01/2019   HGB 13.9 01/01/2019   HCT 41.7 01/01/2019   PLT 205 01/01/2019   LYMPHOPCT 9 01/01/2019   MONOPCT 4 01/01/2019   EOSPCT 3 01/01/2019   BASOPCT 0 01/01/2019    CMP:  Lab Results  Component Value Date   NA 138 01/02/2019   K 3.8 01/02/2019   CL 110 01/02/2019   CO2 22 01/02/2019   BUN 13 01/02/2019    CREATININE 0.69 01/02/2019   PROT 7.3 01/01/2019   ALBUMIN 4.3 01/01/2019   BILITOT 0.6 01/01/2019   ALKPHOS 61 01/01/2019   AST 23 01/01/2019   ALT 26 01/01/2019  .   Total Time in preparing paper work, data evaluation and todays exam - 35 minutes  Norval Morton M.D on 01/02/2019 at 3:15 PM  [facility]   Office  [phone]

## 2019-01-02 NOTE — Consult Note (Signed)
Cardiology Consultation:   Patient ID: Shannon Haney; [id number]; 1951/10/13   Admit date: 01/01/2019 Date of Consult: 01/02/2019  Primary Care Provider: Velna Hatchet, MD Primary Cardiologist: New to [facility]  Patient Profile:   Shannon Haney is a 68 y.o. female with a hx of hypertension, hyperlipidemia and generalized anxiety disorder who is being seen today for the evaluation of intermittent symptomatic bradycardia at the request of Dr. Sloan Haney.  History of Present Illness:   Shannon Haney is a 68 year old female with a history stated above who presented to [facility] on 01/02/2019 after having 3 total episodes of transient symptoms including nausea, diaphoresis and dizziness.  Patient reports she was in her usual state of health prior to 4 AM on Monday morning when she woke with significant nausea and diaphoresis.  The episode dissipated on its own and she went back to bed.  She reports another episode around 6:30 AM however less intense in which she proceeded to [facility] for further evaluation.  While in the waiting room, she reported having another episode. Per ED chart review, she was placed on a monitor became diaphoretic, nauseated and pale with a heart rate 48-50 bpm. She was given IV fluid hydration and Zofran on presentation.  Troponin x2 have been negative. EKG with no acute ischemic changes, nonspecific T wave abnormalities and low voltage QRS. She was given ASA, SL NTG along with IV fluids and Zofran. Echocardiogram performed 01/02/2019 which showed an LVEF of 60 to 65% with no regional wall motion abnormalities. Doppler parameters were consistent with impaired relaxation. No evidence of valvular disease. She reports no tobacco, alcohol or illicit drug use.  She has a family history of CAD in her mother as well as a son died of cardiac arrest 2 years ago at the age of 71 years.  After his death, she reports a cardiac work-up with what appears to be a  Cardiac CT calcium scoring  performed on 07/26/2018.  Results indicated no visible coronary artery calcifications with punctate calcifications in the region of the aortic valve and no evidence of aortic aneurysm.  Calcium score was 0 which seems reassuring with her presenting symptoms.   On our interview, she denies chest pain, palpitations, orthopnea, PND, shortness of breath or lower extremity swelling.  She does state that for the last week prior to presentation she had right arm "achiness ". She states this could be in conjunction with housework which correlates with timing.  She reports she took Tylenol with minimal relief. The arm pain is not currently present.  She denies chest pain.  She reports no recent illness with fever, chills. On Sunday she reports drinking approximately 60 ounces of water with good urine output excluding dehydration as a possible etiology.   Cardiology was asked to evaluate given the above symptoms.  Past Medical History:  Diagnosis Date  . Depression   . Hyperlipidemia   . Hypertension   . Seasonal allergies    Past Surgical History:  Procedure Laterality Date  . BREAST ENHANCEMENT SURGERY    . CHOLECYSTECTOMY    . VAGINAL HYSTERECTOMY       Prior to Admission medications   Medication Sig Start Date End Date Taking? Authorizing Provider  Acetaminophen (TYLENOL EXTRA STRENGTH PO) Take 1,000 mg by mouth every 8 (eight) hours as needed (pain).    Yes [provider]  diphenhydramine-acetaminophen (TYLENOL PM) 25-500 MG TABS Take 1 tablet by mouth at bedtime as needed (sleep).    Yes  [provider]  escitalopram (LEXAPRO) 10 MG tablet Take 10 mg by mouth daily.   Yes [provider]  lisinopril (PRINIVIL,ZESTRIL) 20 MG tablet Take 20 mg by mouth daily.    Yes [provider]  simvastatin (ZOCOR) 20 MG tablet Take 20 mg by mouth daily at 6 PM.    Yes [provider]    Inpatient Medications: Scheduled Meds: . aspirin EC  81 mg Oral Daily  .  enoxaparin (LOVENOX) injection  40 mg Subcutaneous Q24H  . escitalopram  10 mg Oral Daily  . lisinopril  20 mg Oral Daily  . simvastatin  20 mg Oral q1800   Continuous Infusions:  PRN Meds: diphenhydrAMINE **AND** acetaminophen, acetaminophen, morphine injection, nitroGLYCERIN, ondansetron (ZOFRAN) IV  Allergies:    Allergies  Allergen Reactions  . Sulfa Antibiotics Rash    Fever, joint aches    Social History:   Social History   Socioeconomic History  . Marital status: Married    Spouse name: Not on file  . Number of children: Not on file  . Years of education: Not on file  . Highest education level: Not on file  Occupational History  . Not on file  Social Needs  . Financial resource strain: Not on file  . Food insecurity:    Worry: Not on file    Inability: Not on file  . Transportation needs:    Medical: Not on file    Non-medical: Not on file  Tobacco Use  . Smoking status: Never Smoker  . Smokeless tobacco: Never Used  Substance and Sexual Activity  . Alcohol use: Yes    Alcohol/week: 5.0 standard drinks    Types: 3 Glasses of wine, 2 Standard drinks or equivalent per week  . Drug use: No  . Sexual activity: Not on file  Lifestyle  . Physical activity:    Days per week: Not on file    Minutes per session: Not on file  . Stress: Not on file  Relationships  . Social connections:    Talks on phone: Not on file    Gets together: Not on file    Attends religious service: Not on file    Active member of club or organization: Not on file    Attends meetings of clubs or organizations: Not on file    Relationship status: Not on file  . Intimate partner violence:    Fear of current or ex partner: Not on file    Emotionally abused: Not on file    Physically abused: Not on file    Forced sexual activity: Not on file  Other Topics Concern  . Not on file  Social History Narrative  . Not on file    Family History:   Family History  Problem Relation Age of  Onset  . Prostate cancer Father   . Colon cancer Neg Hx   . Esophageal cancer Neg Hx   . Rectal cancer Neg Hx   . Stomach cancer Neg Hx    Family Status:  Family Status  Relation Name Status  . Father  (Not Specified)  . Neg Hx  (Not Specified)    ROS:  Please see the history of present illness.  All other ROS reviewed and negative.     Physical Exam/Data:   Vitals:   01/01/19 2108 01/02/19 0022 01/02/19 0600 01/02/19 1246  BP: 121/61 (!) 106/59 100/64 112/64  Pulse: 77 71 64 66  Resp: 18 18 16 18   Temp:  98.1 F (36.7 C) 98.4 F (36.9 C) 98.1 F (36.7 C) 98.4 F (36.9 C)  TempSrc: Oral Oral Oral Oral  SpO2: 96% 96% 98% 97%  Weight:      Height:        Intake/Output Summary (Last 24 hours) at 01/02/2019 1255 Last data filed at 01/02/2019 1247 Gross per 24 hour  Intake 480 ml  Output -  Net 480 ml   Filed Weights   01/01/19 0811 01/01/19 1254  Weight: 79.4 kg 80.5 kg   Body mass index is 26.98 kg/m.   General: Well developed, well nourished, NAD Skin: Warm, dry, intact  Head: Normocephalic, atraumatic, sclera non-icteric, no xanthomas, clear, moist mucus membranes. Neck: Negative for carotid bruits. No JVD Lungs:Clear to ausculation bilaterally. No wheezes, rales, or rhonchi. Breathing is unlabored. Cardiovascular: RRR with S1 S2. No murmurs, rubs, gallops, or LV heave appreciated. Abdomen: Soft, non-tender, non-distended with normoactive bowel sounds. No hepatomegaly, No rebound/guarding. No obvious abdominal masses. MSK: Strength and tone appear normal for age. 5/5 in all extremities Extremities: No edema. No clubbing or cyanosis. DP/PT pulses 2+ bilaterally Neuro: Alert and oriented. No focal deficits. No facial asymmetry. MAE spontaneously. Psych: Responds to questions appropriately with normal affect.     EKG:  The EKG was personally reviewed and demonstrates: 01/02/2019 NSR with no acute ischemic changes, nonspecific T wave abnormalities and low  voltage QRS. Telemetry:  Telemetry was personally reviewed and demonstrates: NSR with HR 64  Relevant CV Studies:  ECHO: 01/02/2019  Left Ventricle: The left ventricle has normal systolic function, with an ejection fraction of 60-65%. The cavity size was normal. There is no increase in left ventricular wall thickness. Left ventricular diastolic Doppler parameters are consistent with  impaired relaxation. No evidence of left ventricular regional wall motion abnormalities.. Right Ventricle: The right ventricle has normal systolic function. The cavity was normal. There is no increase in right ventricular wall thickness. Left Atrium: left atrial size was normal in size Right Atrium: right atrial size was normal in size. Right atrial pressure is estimated at 3 mmHg. Interatrial Septum: The interatrial septum was not well visualized. Pericardium: There is no evidence of pericardial effusion. Mitral Valve: The mitral valve is normal in structure. Mitral valve regurgitation is trivial by color flow Doppler. Tricuspid Valve: The tricuspid valve is normal in structure. Tricuspid valve regurgitation was not visualized by color flow Doppler. Aortic Valve: The aortic valve is normal in structure. Aortic valve regurgitation was not visualized by color flow Doppler. There is no evidence of aortic valve stenosis. Pulmonic Valve: The pulmonic valve was grossly normal. Pulmonic valve regurgitation is not visualized by color flow Doppler. Aorta: The aortic root and ascending aorta are normal in size and structure. Venous: The inferior vena cava is normal in size with greater than 50% respiratory variability.  Cardiac CT with calcium score: 07/26/2018  Total Agatston Score: 0  MESA database percentile:  0  OTHER FINDINGS:  Cardiovascular: No visible coronary artery calcifications. Punctate calcifications in the region of the aortic valve. Heart is normal size. No evidence of aortic  aneurysm.  Mediastinum/Nodes: No adenopathy in the visualized lower mediastinum or hila.  Lungs/Pleura: No confluent opacities or effusions.  Upper Abdomen: Imaging into the upper abdomen shows no acute findings.  Musculoskeletal: Bilateral breast implants partially imaged. Chest wall soft tissues otherwise unremarkable. No acute bony abnormality.  IMPRESSION: No visible coronary artery calcifications. Total coronary calcium score of 0.  No acute or significant extracardiac abnormality.  Laboratory  Data:  Chemistry Recent Labs  Lab 01/01/19 0824 01/02/19 0926  NA 134* 138  K 3.8 3.8  CL 105 110  CO2 19* 22  GLUCOSE 122* 110*  BUN 23 13  CREATININE 0.74 0.69  CALCIUM 8.7* 8.5*  GFRNONAA >60 >60  GFRAA >60 >60  ANIONGAP 10 6    Total Protein  Date Value Ref Range Status  01/01/2019 7.3 6.5 - 8.1 g/dL Final   Albumin  Date Value Ref Range Status  01/01/2019 4.3 3.5 - 5.0 g/dL Final   AST  Date Value Ref Range Status  01/01/2019 23 15 - 41 U/L Final   ALT  Date Value Ref Range Status  01/01/2019 26 0 - 44 U/L Final   Alkaline Phosphatase  Date Value Ref Range Status  01/01/2019 61 38 - 126 U/L Final   Total Bilirubin  Date Value Ref Range Status  01/01/2019 0.6 0.3 - 1.2 mg/dL Final   Hematology Recent Labs  Lab 01/01/19 0824  WBC 7.8  RBC 4.58  HGB 13.9  HCT 41.7  MCV 91.0  MCH 30.3  MCHC 33.3  RDW 12.4  PLT 205   Cardiac Enzymes Recent Labs  Lab 01/01/19 0824 01/01/19 1149 01/01/19 1613 01/01/19 2155 01/02/19 0422  TROPONINI <0.03 <0.03 <0.03 <0.03 <0.03   No results for input(s): TROPIPOC in the last 168 hours.  BNP Recent Labs  Lab 01/01/19 0824  BNP 29.8    DDimer  Recent Labs  Lab 01/01/19 0824  DDIMER <0.27   TSH:  Lab Results  Component Value Date   TSH 2.493 01/02/2019   Lipids:No results found for: CHOL, HDL, LDLCALC, LDLDIRECT, TRIG, CHOLHDL HgbA1c:No results found for: HGBA1C  Radiology/Studies:   Dg Chest 2 View  Result Date: 01/01/2019 CLINICAL DATA:  Shortness of breath.  Hypertension. EXAM: CHEST - 2 VIEW COMPARISON:  None. FINDINGS: No edema or consolidation. The heart size and pulmonary vascularity are normal. No adenopathy. No bone lesions. IMPRESSION: No edema or consolidation. Electronically Signed   By: Lowella Grip III M.D.   On: 01/01/2019 08:30   Assessment and Plan:   1. Presumed symptomatic bradycardia: -Pt presented with significant transient episodes of flushing,  nausea with diaphoresis. The episode dissipated on its own and recurred again around 6:30 AM this morning however was less intense. This precipitated her arrival to [facility]. She had one additional episode while int he ED. Telemetry showed her lowest HR at 48bpm with no recurrent episodes since admission  -Not currently on AV blocking agents -TSH, 2.4193 -Magnesium within normal limits at 2.1 -Echocardiogram without evidence of systolic dysfunction with LVEF of 60 to 65% with no valvular disease.  There is evidence of diastolic dysfunction, not graded. -Underwent a cardiac CT with calcium scoring on 07/26/2018 which is reassuring with no visible coronary artery calcifications with total calcium score of 0. -Plan for outpatient event monitor for further assessment and follow-up with cardiology after two week duration. -Very low suspicion for ACS in the setting of negative troponin x2, nonischemic EKG and essentially negative coronary CT less than six months ago.  2.  Hypertension: -Stable, 112/64>100/64>106/59 -Continue lisinopril 20 mg daily  3.  HLD: -Will obtain lipid panel, -Continue simvastatin 20 mg daily  4.  Anxiety: -Stable, continue Lexapro 10 mg daily  5. Hypocalcemia: -Serum calcium, 8.5 today -No clinical s/s>>will order ionized calcium for confirmation -Will need OP supplementation and close monitoring with PCP   For questions or updates, please contact [place] Please consult  [email or web address] for contact info under Cardiology/STEMI.   SignedKathyrn Drown NP-C [place] Pager: [phone] 01/02/2019 12:55 PM

## 2019-01-18 ENCOUNTER — Other Ambulatory Visit: Payer: Self-pay | Admitting: Cardiology

## 2019-01-18 ENCOUNTER — Telehealth: Payer: Self-pay | Admitting: *Deleted

## 2019-01-18 DIAGNOSIS — R001 Bradycardia, unspecified: Secondary | ICD-10-CM

## 2019-01-18 DIAGNOSIS — R42 Dizziness and giddiness: Secondary | ICD-10-CM

## 2019-01-18 NOTE — Telephone Encounter (Signed)
Patient contacted regarding 01/22/19 appointment to have a 14 day cardiac event monitor applied.  Explained to patient we are cancelling appointment due to COVID-19 precautions.  I will be able to enroll the patient for [employer] to ship a 14 day cardiac event monitor directly to her home via [place].  Brief instructions were given and patient informed [employer] will be available for questions 24/7 by phone.  They will also call to confirm a shipping address prior to sending monitor out.  Patient agreed to have monitor shipped directly.  Patient will be enrolled today for [employer] to ship a 14 day cardiac event monitor.

## 2019-01-22 ENCOUNTER — Ambulatory Visit (INDEPENDENT_AMBULATORY_CARE_PROVIDER_SITE_OTHER): Payer: Medicare Other

## 2019-01-22 DIAGNOSIS — R001 Bradycardia, unspecified: Secondary | ICD-10-CM | POA: Diagnosis not present

## 2019-01-22 DIAGNOSIS — R42 Dizziness and giddiness: Secondary | ICD-10-CM

## 2019-02-01 ENCOUNTER — Telehealth: Payer: Self-pay

## 2019-02-01 NOTE — Telephone Encounter (Signed)
Virtual Visit Pre-Appointment Phone Call  Steps For Call: [place] PHONE   1. Confirm consent - "In the setting of the current Covid19 crisis, you are scheduled for a (phone or video) visit with your provider on (date) at (time).  Just as we do with many in-office visits, in order for you to participate in this visit, we must obtain consent.  If you'd like, I can send this to your mychart (if signed up) or email for you to review.  Otherwise, I can obtain your verbal consent now.  All virtual visits are billed to your insurance company just like a normal visit would be.  By agreeing to a virtual visit, we'd like you to understand that the technology does not allow for your provider to perform an examination, and thus may limit your provider's ability to fully assess your condition.  Finally, though the technology is pretty good, we cannot assure that it will always work on either your or our end, and in the setting of a video visit, we may have to convert it to a phone-only visit.  In either situation, we cannot ensure that we have a secure connection.  Are you willing to proceed?"  2. Give patient instructions for WebEx download to smartphone as below if video visit  3. Advise patient to be prepared with any vital sign or heart rhythm information, their current medicines, and a piece of paper and pen handy for any instructions they may receive the day of their visit  4. Inform patient they will receive a phone call 15 minutes prior to their appointment time (may be from unknown caller ID) so they should be prepared to answer  5. Confirm that appointment type is correct in Epic appointment notes (video vs telephone)    TELEPHONE CALL NOTE  Shannon Haney has been deemed a candidate for a follow-up tele-health visit to limit community exposure during the Covid-19 pandemic. I spoke with the patient via phone to ensure availability of phone/video source, confirm preferred email & phone  number, and discuss instructions and expectations.  I reminded Shannon Haney to be prepared with any vital sign and/or heart rhythm information that could potentially be obtained via home monitoring, at the time of her visit. I reminded Shannon Haney to expect a phone call at the time of her visit if her visit.  Did the patient verbally acknowledge consent to treatment?   Zebedee Iba, [place] 02/01/2019 10:42 AM   DOWNLOADING THE [place]  - If Apple, go to [employer] and type in WebEx in the search bar. [place] [employer], the blue/Ashmi Blas circle. The app is free but as with any other app downloads, their phone may require them to verify saved payment information or Apple password. The patient does NOT have to create an account.  - If Android, ask patient to go to [employer] and type in WebEx in the search bar. [place] [employer], the blue/Kathaleen Dudziak circle. The app is free but as with any other app downloads, their phone may require them to verify saved payment information or Android password. The patient does NOT have to create an account.   CONSENT FOR TELE-HEALTH VISIT - PLEASE REVIEW  I hereby voluntarily request, consent and authorize [facility] and its employed or contracted physicians, physician assistants, nurse practitioners or other licensed health care professionals (the Practitioner), to provide me with telemedicine health care services (the "Services") as deemed necessary by the  treating Practitioner. I acknowledge and consent to receive the Services by the Practitioner via telemedicine. I understand that the telemedicine visit will involve communicating with the Practitioner through live audiovisual communication technology and the disclosure of certain medical information by electronic transmission. I acknowledge that I have been given the opportunity to request an in-person assessment or other available alternative prior to the  telemedicine visit and am voluntarily participating in the telemedicine visit.  I understand that I have the right to withhold or withdraw my consent to the use of telemedicine in the course of my care at any time, without affecting my right to future care or treatment, and that the Practitioner or I may terminate the telemedicine visit at any time. I understand that I have the right to inspect all information obtained and/or recorded in the course of the telemedicine visit and may receive copies of available information for a reasonable fee.  I understand that some of the potential risks of receiving the Services via telemedicine include:  Marland Kitchen Delay or interruption in medical evaluation due to technological equipment failure or disruption; . Information transmitted may not be sufficient (e.g. poor resolution of images) to allow for appropriate medical decision making by the Practitioner; and/or  . In rare instances, security protocols could fail, causing a breach of personal health information.  Furthermore, I acknowledge that it is my responsibility to provide information about my medical history, conditions and care that is complete and accurate to the best of my ability. I acknowledge that Practitioner's advice, recommendations, and/or decision may be based on factors not within their control, such as incomplete or inaccurate data provided by me or distortions of diagnostic images or specimens that may result from electronic transmissions. I understand that the practice of medicine is not an exact science and that Practitioner makes no warranties or guarantees regarding treatment outcomes. I acknowledge that I will receive a copy of this consent concurrently upon execution via email to the email address I last provided but may also request a printed copy by calling the office of [facility].    I understand that my insurance will be billed for this visit.   I have read or had this consent read to me.  . I understand the contents of this consent, which adequately explains the benefits and risks of the Services being provided via telemedicine.  . I have been provided ample opportunity to ask questions regarding this consent and the Services and have had my questions answered to my satisfaction. . I give my informed consent for the services to be provided through the use of telemedicine in my medical care  By participating in this telemedicine visit I agree to the above.

## 2019-02-09 ENCOUNTER — Telehealth (INDEPENDENT_AMBULATORY_CARE_PROVIDER_SITE_OTHER): Payer: Medicare Other | Admitting: Physician Assistant

## 2019-02-09 ENCOUNTER — Encounter: Payer: Self-pay | Admitting: Physician Assistant

## 2019-02-09 DIAGNOSIS — I10 Essential (primary) hypertension: Secondary | ICD-10-CM

## 2019-02-09 DIAGNOSIS — R001 Bradycardia, unspecified: Secondary | ICD-10-CM

## 2019-02-09 DIAGNOSIS — E785 Hyperlipidemia, unspecified: Secondary | ICD-10-CM

## 2019-02-09 NOTE — Patient Instructions (Signed)
Medication Instructions:   Your physician recommends that you continue on your current medications as directed. Please refer to the Current Medication list given to you today.  If you need a refill on your cardiac medications before your next appointment, please call your pharmacy.   Lab work:  NONE ordered at this time of appointment   If you have labs (blood work) drawn today and your tests are completely normal, you will receive your results only by: Marland Kitchen MyChart Message (if you have MyChart) OR . A paper copy in the mail If you have any lab test that is abnormal or we need to change your treatment, we will call you to review the results.  Testing/Procedures:  NONE ordered at this time of appointment   Follow-Up: . Your physician recommends that you schedule a follow-up appointment after your Holter monitor results are received by our office    Any Other Special Instructions Will Be Listed Below (If Applicable).

## 2019-02-09 NOTE — Progress Notes (Signed)
Virtual Visit via Video Note   This visit type was conducted due to national recommendations for restrictions regarding the COVID-19 Pandemic (e.g. social distancing) in an effort to limit this patient's exposure and mitigate transmission in our community.  Due to her co-morbid illnesses, this patient is at least at moderate risk for complications without adequate follow up.  This format is felt to be most appropriate for this patient at this time.  All issues noted in this document were discussed and addressed.  A limited physical exam was performed with this format.  Please refer to the patient's chart for her consent to telehealth for [facility].   Evaluation Performed:  Follow-up visit  Date:  02/11/2019   ID:  Shannon Haney, DOB September 09, 1951, MRN [id number]  Patient Location: Home Provider Location: Home  PCP:  Mayra Neer, MD  Cardiologist:  Pixie Casino, MD Electrophysiologist:  None   Chief Complaint:  followup  History of Present Illness:    Shannon Haney is a 68 y.o. female with past medical history of hypertension, hyperlipidemia and generalized anxiety disorder.  Patient was recently admitted on 01/01/2019 with 3 episodes of transient symptoms including nausea, diaphoresis and dizziness.  Pablo.  While on the telemetry, she had another episode of nausea and diaphoresis.  Heart rate was 48 to 50 bpm.  She was given IV fluid hydration and Zofran.  Troponin was negative x2.  EKG showed no ischemic changes.  Echocardiogram obtained on 01/02/2019 showed EF of 60 to 65%, no regional wall motion abnormality, no significant valvular issue.  She does have family history of CAD and her son died of cardiac arrest 2 years ago at age 16.  In the hospital, her TSH was normal.  She was not on any AV nodal blocking agent.  She reportedly had cardiac CTA with calcium scoring in October 2019 which was reassuring with no visible coronary artery calcification and a total  calcium score of 0.  There was low suspicion of ACS.  It was suspected the bradycardia was vagally mediated secondary to GI stimulation.  Outpatient 2 week Zio monitor was recommended.  Patient follow-up via video conference visit today.  She just turned in her Zio monitor today, therefore the result will be ready until next week.  Otherwise since discharge, she has not had any further nausea, diaphoresis or dizziness.  She denies any chest pain, shortness of breath with exertion or feeling of passing out.  If her Zio monitor is normal, she can follow-up with cardiology only as needed basis.  If there is arrhythmia on the monitor, I will arrange follow-up with Dr. Debara Pickett.  The patient does not have symptoms concerning for COVID-19 infection (fever, chills, cough, or new shortness of breath).    Past Medical History:  Diagnosis Date   Depression    Hyperlipidemia    Hypertension    Seasonal allergies    Past Surgical History:  Procedure Laterality Date   BREAST ENHANCEMENT SURGERY     CHOLECYSTECTOMY     VAGINAL HYSTERECTOMY       Current Meds  Medication Sig   aspirin EC 81 MG EC tablet Take 1 tablet (81 mg total) by mouth daily.   diphenhydramine-acetaminophen (TYLENOL PM) 25-500 MG TABS Take 1 tablet by mouth at bedtime as needed (sleep).    escitalopram (LEXAPRO) 10 MG tablet Take 10 mg by mouth daily.   lisinopril (PRINIVIL,ZESTRIL) 20 MG tablet Take 20 mg by mouth daily.  simvastatin (ZOCOR) 20 MG tablet Take 20 mg by mouth daily at 6 PM.      Allergies:   Sulfa antibiotics   Social History   Tobacco Use   Smoking status: Never Smoker   Smokeless tobacco: Never Used  Substance Use Topics   Alcohol use: Yes    Alcohol/week: 5.0 standard drinks    Types: 3 Glasses of wine, 2 Standard drinks or equivalent per week   Drug use: No     Family Hx: The patient's family history includes Prostate cancer in her father. There is no history of Colon cancer,  Esophageal cancer, Rectal cancer, or Stomach cancer.  ROS:   Please see the history of present illness.     All other systems reviewed and are negative.   Prior CV studies:   The following studies were reviewed today:  Echo 01/01/2019 1. The left ventricle has normal systolic function with an ejection fraction of 60-65%. The cavity size was normal. Left ventricular diastolic Doppler parameters are consistent with impaired relaxation. No evidence of left ventricular regional wall  motion abnormalities.  2. The right ventricle has normal systolic function. The cavity was normal. There is no increase in right ventricular wall thickness.  3. The aortic root and ascending aorta are normal in size and structure.  4. The interatrial septum was not well visualized.  Labs/Other Tests and Data Reviewed:    EKG:  An ECG dated 01/02/2019 was personally reviewed today and demonstrated:  Normal sinus rhythm, low voltage.  Recent Labs: 01/01/2019: ALT 26; B Natriuretic Peptide 29.8; Hemoglobin 13.9; Platelets 205 01/02/2019: BUN 13; Creatinine, Ser 0.69; Magnesium 2.1; Potassium 3.8; Sodium 138; TSH 2.493   Recent Lipid Panel Lab Results  Component Value Date/Time   CHOL 154 01/02/2019 09:26 AM   TRIG 186 (H) 01/02/2019 09:26 AM   HDL 37 (L) 01/02/2019 09:26 AM   CHOLHDL 4.2 01/02/2019 09:26 AM   LDLCALC 80 01/02/2019 09:26 AM    Wt Readings from Last 3 Encounters:  01/01/19 177 lb 7.5 oz (80.5 kg)  06/27/14 179 lb (81.2 kg)  05/27/14 179 lb (81.2 kg)     Objective:    Vital Signs:  There were no vitals taken for this visit.   VITAL SIGNS:  reviewed GEN:  no acute distress  ASSESSMENT & PLAN:    1. Bradycardia: Occurred during GI stimulation related to diaphoresis and nausea.  Since discharge, she has not had any more GI symptoms.  She denies any dizziness.  She did wear a 14-day Zio monitor and just returned to monitor yesterday.  The data of the monitor is not available to me.  I will  follow-up on the monitor result next week.  If she does have arrhythmia, she will need to follow-up with Dr. Debara Pickett, otherwise if the monitor report is normal, she can follow-up with cardiology on a as needed basis.  2. Hypertension: Unable to assess blood pressure today.  Continue on current therapy  3. Hyperlipidemia: On Zocor 20 mg daily  COVID-19 Education: The signs and symptoms of COVID-19 were discussed with the patient and how to seek care for testing (follow up with PCP or arrange E-visit).  The importance of social distancing was discussed today.  Time:   Today, I have spent 8 minutes with the patient with telehealth technology discussing the above problems.     Medication Adjustments/Labs and Tests Ordered: Current medicines are reviewed at length with the patient today.  Concerns regarding medicines are outlined  above.   Tests Ordered: No orders of the defined types were placed in this encounter.   Medication Changes: No orders of the defined types were placed in this encounter.   Disposition:  Follow up Pending monitor result  Signed, Almyra Deforest, PA  02/11/2019 11:17 PM    [facility]

## 2019-02-15 ENCOUNTER — Other Ambulatory Visit: Payer: Self-pay

## 2019-05-25 ENCOUNTER — Encounter: Payer: Self-pay | Admitting: Internal Medicine

## 2019-06-25 ENCOUNTER — Other Ambulatory Visit: Payer: Self-pay | Admitting: Obstetrics

## 2019-06-25 ENCOUNTER — Encounter: Payer: Self-pay | Admitting: Internal Medicine

## 2019-06-25 DIAGNOSIS — Z1231 Encounter for screening mammogram for malignant neoplasm of breast: Secondary | ICD-10-CM

## 2019-07-17 ENCOUNTER — Ambulatory Visit (AMBULATORY_SURGERY_CENTER): Payer: Self-pay

## 2019-07-17 ENCOUNTER — Encounter: Payer: Self-pay | Admitting: Internal Medicine

## 2019-07-17 ENCOUNTER — Other Ambulatory Visit: Payer: Self-pay

## 2019-07-17 VITALS — Temp 96.8°F | Ht 68.0 in | Wt 185.0 lb

## 2019-07-17 DIAGNOSIS — Z86010 Personal history of colonic polyps: Secondary | ICD-10-CM

## 2019-07-17 MED ORDER — NA SULFATE-K SULFATE-MG SULF 17.5-3.13-1.6 GM/177ML PO SOLN: 1.0000 | Freq: Once | ORAL | 0 refills | Status: AC

## 2019-07-17 NOTE — Progress Notes (Signed)
Denies allergies to eggs or soy products. Denies complication of anesthesia or sedation. Denies use of weight loss medication. Denies use of O2.   Emmi instructions given for colonoscopy.  

## 2019-07-30 ENCOUNTER — Telehealth: Payer: Self-pay | Admitting: Internal Medicine

## 2019-07-30 NOTE — Telephone Encounter (Signed)

## 2019-07-31 ENCOUNTER — Ambulatory Visit (AMBULATORY_SURGERY_CENTER): Payer: Medicare Other | Admitting: Internal Medicine

## 2019-07-31 ENCOUNTER — Encounter: Payer: Self-pay | Admitting: Internal Medicine

## 2019-07-31 ENCOUNTER — Other Ambulatory Visit: Payer: Self-pay

## 2019-07-31 ENCOUNTER — Other Ambulatory Visit: Payer: Self-pay | Admitting: Internal Medicine

## 2019-07-31 VITALS — BP 124/67 | HR 59 | Temp 98.6°F | Resp 14 | Ht 68.0 in | Wt 185.0 lb

## 2019-07-31 DIAGNOSIS — Z86010 Personal history of colonic polyps: Secondary | ICD-10-CM

## 2019-07-31 DIAGNOSIS — D125 Benign neoplasm of sigmoid colon: Secondary | ICD-10-CM | POA: Diagnosis not present

## 2019-07-31 DIAGNOSIS — D123 Benign neoplasm of transverse colon: Secondary | ICD-10-CM | POA: Diagnosis not present

## 2019-07-31 MED ORDER — SODIUM CHLORIDE 0.9 % IV SOLN: 500.0000 mL | Freq: Once | INTRAVENOUS | Status: DC

## 2019-07-31 NOTE — Progress Notes (Signed)
Pt's states no medical or surgical changes since previsit or office visit.  JB - temp CW - vitals. 

## 2019-07-31 NOTE — Progress Notes (Signed)
To PACU, VSS. Report to RN.tb 

## 2019-07-31 NOTE — Op Note (Signed)
[place] Patient Name: Shannon Haney Procedure Date: 07/31/2019 9:30 AM MRN: [id number] Endoscopist: Jerene Bears , MD Age: 68 Referring MD:  Date of Birth: 04/03/1951 Gender: Female Account #: [id number] Procedure:                Colonoscopy Indications:              High risk colon cancer surveillance: Personal                            history of non-advanced adenoma, Last colonoscopy:                            September 2015 Medicines:                Monitored Anesthesia Care Procedure:                Pre-Anesthesia Assessment:                           - Prior to the procedure, a History and Physical                            was performed, and patient medications and                            allergies were reviewed. The patient's tolerance of                            previous anesthesia was also reviewed. The risks                            and benefits of the procedure and the sedation                            options and risks were discussed with the patient.                            All questions were answered, and informed consent                            was obtained. Prior Anticoagulants: The patient has                            taken no previous anticoagulant or antiplatelet                            agents. ASA Grade Assessment: II - A patient with                            mild systemic disease. After reviewing the risks                            and benefits, the patient was deemed in  satisfactory condition to undergo the procedure.                           After obtaining informed consent, the colonoscope                            was passed under direct vision. Throughout the                            procedure, the patient's blood pressure, pulse, and                            oxygen saturations were monitored continuously. The                            Colonoscope was introduced through the anus and                             advanced to the terminal ileum. The colonoscopy was                            performed without difficulty. The patient tolerated                            the procedure well. The quality of the bowel                            preparation was good. The terminal ileum, ileocecal                            valve, appendiceal orifice, and rectum were                            photographed. Scope In: 9:39:34 AM Scope Out: 9:56:28 AM Scope Withdrawal Time: 0 hours 14 minutes 58 seconds  Total Procedure Duration: 0 hours 16 minutes 54 seconds  Findings:                 The digital rectal exam was normal.                           Two sessile polyps were found in the transverse                            colon. The polyps were 4 to 8 mm in size. These                            polyps were removed with a cold snare. Resection                            and retrieval were complete.                           A 6 mm polyp was found in the sigmoid colon. The  polyp was sessile. The polyp was removed with a                            cold snare. Resection and retrieval were complete.                           A few medium-mouthed diverticula were found in the                            sigmoid colon.                           The retroflexed view of the distal rectum and anal                            verge was normal and showed no anal or rectal                            abnormalities. Complications:            No immediate complications. Estimated Blood Loss:     Estimated blood loss was minimal. Impression:               - Two 4 to 8 mm polyps in the transverse colon,                            removed with a cold snare. Resected and retrieved.                           - One 6 mm polyp in the sigmoid colon, removed with                            a cold snare. Resected and retrieved.                           - Diverticulosis in the  sigmoid colon.                           - The distal rectum and anal verge are normal on                            retroflexion view. Recommendation:           - Patient has a contact number available for                            emergencies. The signs and symptoms of potential                            delayed complications were discussed with the                            patient. Return to normal activities tomorrow.  Written discharge instructions were provided to the                            patient.                           - Resume previous diet.                           - Continue present medications.                           - Await pathology results.                           - Repeat colonoscopy is recommended for                            surveillance. The colonoscopy date will be                            determined after pathology results from today's                            exam become available for review. Jerene Bears, MD 07/31/2019 10:00:03 AM This report has been signed electronically.

## 2019-07-31 NOTE — Progress Notes (Signed)
Called to room to assist during endoscopic procedure.  Patient ID and intended procedure confirmed with present staff. Received instructions for my participation in the procedure from the performing physician.  

## 2019-07-31 NOTE — Patient Instructions (Signed)
Please read handouts provided. Continue present medications. Await pathology results.        YOU HAD AN ENDOSCOPIC PROCEDURE TODAY AT THE [facility]:   Refer to the procedure report that was given to you for any specific questions about what was found during the examination.  If the procedure report does not answer your questions, please call your gastroenterologist to clarify.  If you requested that your care partner not be given the details of your procedure findings, then the procedure report has been included in a sealed envelope for you to review at your convenience later.  YOU SHOULD EXPECT: Some feelings of bloating in the abdomen. Passage of more gas than usual.  Walking can help get rid of the air that was put into your GI tract during the procedure and reduce the bloating. If you had a lower endoscopy (such as a colonoscopy or flexible sigmoidoscopy) you may notice spotting of blood in your stool or on the toilet paper. If you underwent a bowel prep for your procedure, you may not have a normal bowel movement for a few days.  Please Note:  You might notice some irritation and congestion in your nose or some drainage.  This is from the oxygen used during your procedure.  There is no need for concern and it should clear up in a day or so.  SYMPTOMS TO REPORT IMMEDIATELY:   Following lower endoscopy (colonoscopy or flexible sigmoidoscopy):  Excessive amounts of blood in the stool  Significant tenderness or worsening of abdominal pains  Swelling of the abdomen that is new, acute  Fever of 100F or higher    For urgent or emergent issues, a gastroenterologist can be reached at any hour by calling [phone].   DIET:  We do recommend a small meal at first, but then you may proceed to your regular diet.  Drink plenty of fluids but you should avoid alcoholic beverages for 24 hours.  ACTIVITY:  You should plan to take it easy for the rest of today and you should NOT  DRIVE or use heavy machinery until tomorrow (because of the sedation medicines used during the test).    FOLLOW UP: Our staff will call the number listed on your records 48-72 hours following your procedure to check on you and address any questions or concerns that you may have regarding the information given to you following your procedure. If we do not reach you, we will leave a message.  We will attempt to reach you two times.  During this call, we will ask if you have developed any symptoms of COVID 19. If you develop any symptoms (ie: fever, flu-like symptoms, shortness of breath, cough etc.) before then, please call [phone].  If you test positive for Covid 19 in the 2 weeks post procedure, please call and report this information to us.    If any biopsies were taken you will be contacted by phone or by letter within the next 1-3 weeks.  Please call us at [phone] if you have not heard about the biopsies in 3 weeks.    SIGNATURES/CONFIDENTIALITY: You and/or your care partner have signed paperwork which will be entered into your electronic medical record.  These signatures attest to the fact that that the information above on your After Visit Summary has been reviewed and is understood.  Full responsibility of the confidentiality of this discharge information lies with you and/or your care-partner. 

## 2019-08-02 ENCOUNTER — Telehealth: Payer: Self-pay

## 2019-08-02 ENCOUNTER — Telehealth: Payer: Self-pay | Admitting: *Deleted

## 2019-08-02 NOTE — Telephone Encounter (Signed)
Left message on 2nd follow up call. 

## 2019-08-02 NOTE — Telephone Encounter (Signed)
  Follow up Call-  Call back number 07/31/2019  Post procedure Call Back phone  # [phone]  Permission to leave phone message Yes  Some recent data might be hidden     Patient questions:  Message left to call us if necessary.

## 2019-08-03 ENCOUNTER — Encounter: Payer: Self-pay | Admitting: Internal Medicine

## 2019-08-10 ENCOUNTER — Ambulatory Visit
Admission: RE | Admit: 2019-08-10 | Discharge: 2019-08-10 | Disposition: A | Discharge status: Home / Self Care | Payer: Medicare Other | Source: Ambulatory Visit | Attending: Obstetrics | Admitting: Obstetrics

## 2019-08-10 ENCOUNTER — Other Ambulatory Visit: Payer: Self-pay

## 2019-08-10 DIAGNOSIS — Z1231 Encounter for screening mammogram for malignant neoplasm of breast: Secondary | ICD-10-CM

## 2019-11-22 ENCOUNTER — Ambulatory Visit: Payer: Medicare Other

## 2019-12-03 ENCOUNTER — Ambulatory Visit: Payer: Medicare Other

## 2020-08-08 ENCOUNTER — Other Ambulatory Visit: Payer: Self-pay | Admitting: Family Medicine

## 2020-08-08 DIAGNOSIS — Z1231 Encounter for screening mammogram for malignant neoplasm of breast: Secondary | ICD-10-CM

## 2020-08-14 ENCOUNTER — Other Ambulatory Visit: Payer: Self-pay | Admitting: Family Medicine

## 2020-08-14 DIAGNOSIS — E2839 Other primary ovarian failure: Secondary | ICD-10-CM

## 2020-09-10 ENCOUNTER — Other Ambulatory Visit: Payer: Self-pay

## 2020-09-10 ENCOUNTER — Ambulatory Visit
Admission: RE | Admit: 2020-09-10 | Discharge: 2020-09-10 | Disposition: A | Discharge status: Home / Self Care | Payer: Medicare Other | Source: Ambulatory Visit | Attending: Family Medicine | Admitting: Family Medicine

## 2020-09-10 DIAGNOSIS — Z1231 Encounter for screening mammogram for malignant neoplasm of breast: Secondary | ICD-10-CM

## 2020-11-28 ENCOUNTER — Other Ambulatory Visit: Payer: Medicare Other

## 2021-01-07 DIAGNOSIS — Z20822 Contact with and (suspected) exposure to covid-19: Secondary | ICD-10-CM | POA: Diagnosis not present

## 2021-01-07 DIAGNOSIS — Z03818 Encounter for observation for suspected exposure to other biological agents ruled out: Secondary | ICD-10-CM | POA: Diagnosis not present

## 2021-01-25 IMAGING — CR CHEST - 2 VIEW
2 series · 2 of 2 positions shown · non-contrast
Comparison: None.

CLINICAL DATA: Shortness of breath.  Hypertension.

EXAM:
CHEST - 2 VIEW

[w chest pa]
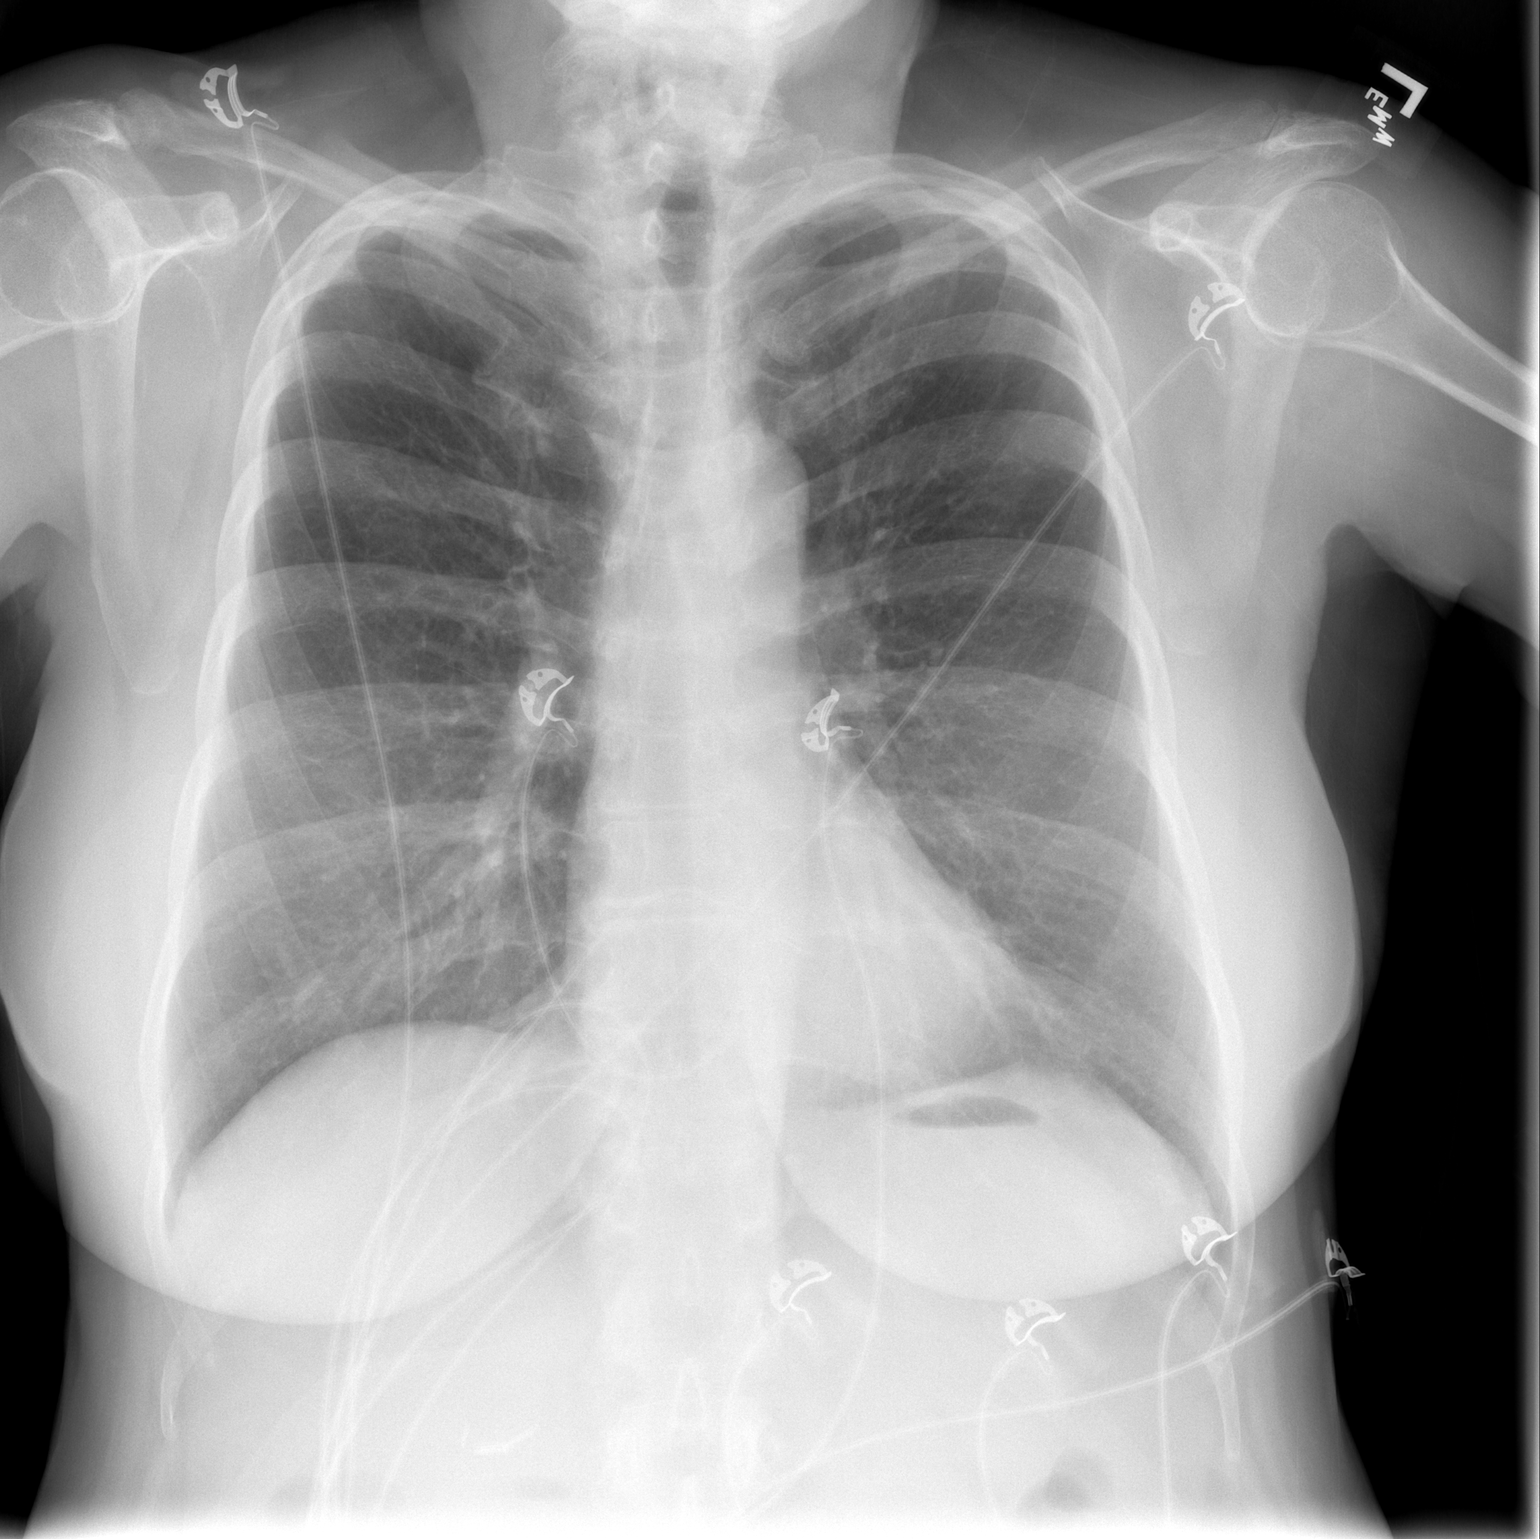

[w chest lat]
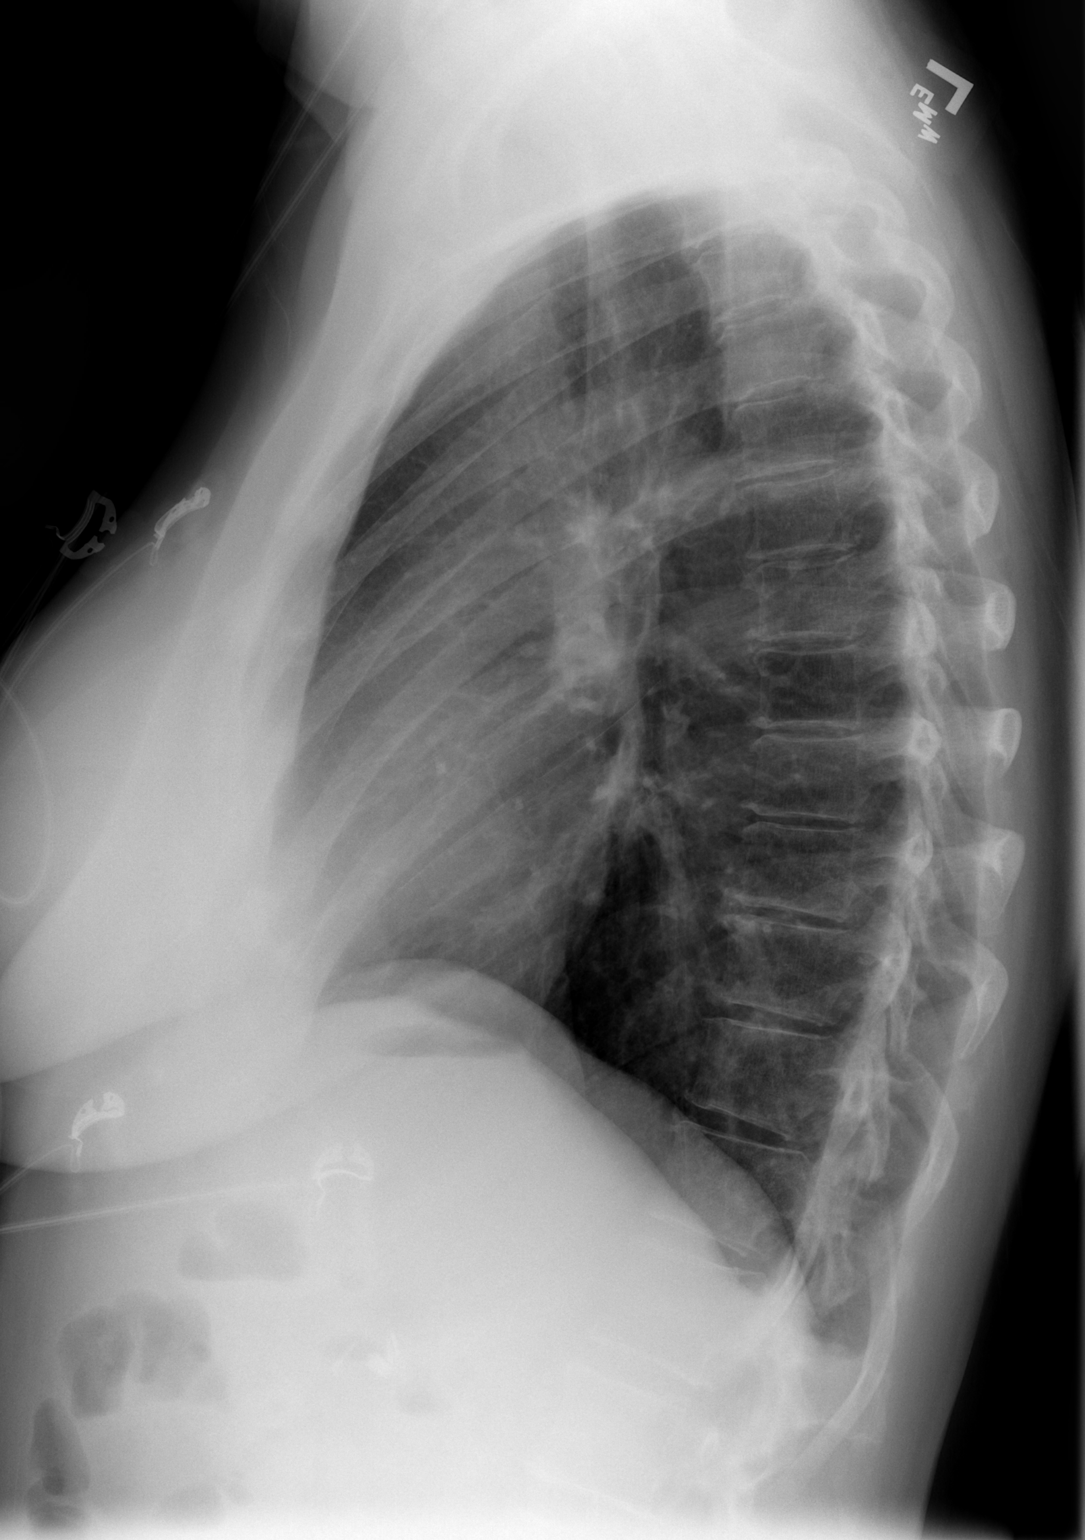

[2 of 2 positions shown; findings below may reference images not displayed]

FINDINGS: No edema or consolidation. The heart size and pulmonary vascularity
are normal. No adenopathy. No bone lesions.
IMPRESSION: No edema or consolidation.

## 2021-01-28 DIAGNOSIS — Z03818 Encounter for observation for suspected exposure to other biological agents ruled out: Secondary | ICD-10-CM | POA: Diagnosis not present

## 2021-01-28 DIAGNOSIS — Z20822 Contact with and (suspected) exposure to covid-19: Secondary | ICD-10-CM | POA: Diagnosis not present

## 2021-04-14 ENCOUNTER — Ambulatory Visit
Admission: RE | Admit: 2021-04-14 | Discharge: 2021-04-14 | Disposition: A | Discharge status: Home / Self Care | Payer: Medicare PPO | Source: Ambulatory Visit | Attending: Family Medicine | Admitting: Family Medicine

## 2021-04-14 ENCOUNTER — Other Ambulatory Visit: Payer: Self-pay | Admitting: Family Medicine

## 2021-04-14 ENCOUNTER — Other Ambulatory Visit: Payer: Self-pay

## 2021-04-14 DIAGNOSIS — Z1231 Encounter for screening mammogram for malignant neoplasm of breast: Secondary | ICD-10-CM

## 2021-04-14 DIAGNOSIS — Z780 Asymptomatic menopausal state: Secondary | ICD-10-CM | POA: Diagnosis not present

## 2021-04-14 DIAGNOSIS — E2839 Other primary ovarian failure: Secondary | ICD-10-CM

## 2021-06-10 DIAGNOSIS — D1801 Hemangioma of skin and subcutaneous tissue: Secondary | ICD-10-CM | POA: Diagnosis not present

## 2021-06-10 DIAGNOSIS — D2262 Melanocytic nevi of left upper limb, including shoulder: Secondary | ICD-10-CM | POA: Diagnosis not present

## 2021-06-10 DIAGNOSIS — D485 Neoplasm of uncertain behavior of skin: Secondary | ICD-10-CM | POA: Diagnosis not present

## 2021-06-10 DIAGNOSIS — L820 Inflamed seborrheic keratosis: Secondary | ICD-10-CM | POA: Diagnosis not present

## 2021-06-10 DIAGNOSIS — L80 Vitiligo: Secondary | ICD-10-CM | POA: Diagnosis not present

## 2021-06-10 DIAGNOSIS — L814 Other melanin hyperpigmentation: Secondary | ICD-10-CM | POA: Diagnosis not present

## 2021-06-10 DIAGNOSIS — D225 Melanocytic nevi of trunk: Secondary | ICD-10-CM | POA: Diagnosis not present

## 2021-06-10 DIAGNOSIS — L821 Other seborrheic keratosis: Secondary | ICD-10-CM | POA: Diagnosis not present

## 2021-07-02 DIAGNOSIS — D485 Neoplasm of uncertain behavior of skin: Secondary | ICD-10-CM | POA: Diagnosis not present

## 2021-07-02 DIAGNOSIS — L988 Other specified disorders of the skin and subcutaneous tissue: Secondary | ICD-10-CM | POA: Diagnosis not present

## 2021-08-19 DIAGNOSIS — E039 Hypothyroidism, unspecified: Secondary | ICD-10-CM | POA: Diagnosis not present

## 2021-08-19 DIAGNOSIS — N182 Chronic kidney disease, stage 2 (mild): Secondary | ICD-10-CM | POA: Diagnosis not present

## 2021-08-19 DIAGNOSIS — G4733 Obstructive sleep apnea (adult) (pediatric): Secondary | ICD-10-CM | POA: Diagnosis not present

## 2021-08-19 DIAGNOSIS — G4700 Insomnia, unspecified: Secondary | ICD-10-CM | POA: Diagnosis not present

## 2021-08-19 DIAGNOSIS — E782 Mixed hyperlipidemia: Secondary | ICD-10-CM | POA: Diagnosis not present

## 2021-08-19 DIAGNOSIS — F3341 Major depressive disorder, recurrent, in partial remission: Secondary | ICD-10-CM | POA: Diagnosis not present

## 2021-08-19 DIAGNOSIS — R197 Diarrhea, unspecified: Secondary | ICD-10-CM | POA: Diagnosis not present

## 2021-08-19 DIAGNOSIS — Z0000 Encounter for general adult medical examination without abnormal findings: Secondary | ICD-10-CM | POA: Diagnosis not present

## 2021-08-19 DIAGNOSIS — I129 Hypertensive chronic kidney disease with stage 1 through stage 4 chronic kidney disease, or unspecified chronic kidney disease: Secondary | ICD-10-CM | POA: Diagnosis not present

## 2021-10-02 DIAGNOSIS — M25561 Pain in right knee: Secondary | ICD-10-CM | POA: Diagnosis not present

## 2021-10-13 ENCOUNTER — Ambulatory Visit
Admission: RE | Admit: 2021-10-13 | Discharge: 2021-10-13 | Disposition: A | Discharge status: Home / Self Care | Payer: Medicare PPO | Source: Ambulatory Visit | Attending: Family Medicine | Admitting: Family Medicine

## 2021-10-13 DIAGNOSIS — Z1231 Encounter for screening mammogram for malignant neoplasm of breast: Secondary | ICD-10-CM

## 2021-11-18 DIAGNOSIS — H2513 Age-related nuclear cataract, bilateral: Secondary | ICD-10-CM | POA: Diagnosis not present

## 2021-11-18 DIAGNOSIS — H5203 Hypermetropia, bilateral: Secondary | ICD-10-CM | POA: Diagnosis not present

## 2021-11-18 DIAGNOSIS — H524 Presbyopia: Secondary | ICD-10-CM | POA: Diagnosis not present

## 2022-01-06 DIAGNOSIS — M25561 Pain in right knee: Secondary | ICD-10-CM | POA: Diagnosis not present

## 2022-03-27 DIAGNOSIS — N390 Urinary tract infection, site not specified: Secondary | ICD-10-CM | POA: Diagnosis not present

## 2022-03-27 DIAGNOSIS — R350 Frequency of micturition: Secondary | ICD-10-CM | POA: Diagnosis not present

## 2022-05-29 ENCOUNTER — Emergency Department (HOSPITAL_BASED_OUTPATIENT_CLINIC_OR_DEPARTMENT_OTHER): Payer: Medicare PPO

## 2022-05-29 ENCOUNTER — Encounter (HOSPITAL_BASED_OUTPATIENT_CLINIC_OR_DEPARTMENT_OTHER): Payer: Self-pay | Admitting: Emergency Medicine

## 2022-05-29 ENCOUNTER — Emergency Department (HOSPITAL_BASED_OUTPATIENT_CLINIC_OR_DEPARTMENT_OTHER)
Admission: EM | Admit: 2022-05-29 | Discharge: 2022-05-29 | Disposition: A | Discharge status: Home / Self Care | Payer: Medicare PPO | Attending: Emergency Medicine | Admitting: Emergency Medicine

## 2022-05-29 ENCOUNTER — Other Ambulatory Visit: Payer: Self-pay

## 2022-05-29 DIAGNOSIS — I10 Essential (primary) hypertension: Secondary | ICD-10-CM | POA: Insufficient documentation

## 2022-05-29 DIAGNOSIS — R059 Cough, unspecified: Secondary | ICD-10-CM | POA: Diagnosis not present

## 2022-05-29 DIAGNOSIS — J40 Bronchitis, not specified as acute or chronic: Secondary | ICD-10-CM | POA: Insufficient documentation

## 2022-05-29 DIAGNOSIS — Z7982 Long term (current) use of aspirin: Secondary | ICD-10-CM | POA: Diagnosis not present

## 2022-05-29 DIAGNOSIS — Z79899 Other long term (current) drug therapy: Secondary | ICD-10-CM | POA: Diagnosis not present

## 2022-05-29 DIAGNOSIS — R0602 Shortness of breath: Secondary | ICD-10-CM | POA: Diagnosis not present

## 2022-05-29 DIAGNOSIS — Z20822 Contact with and (suspected) exposure to covid-19: Secondary | ICD-10-CM | POA: Diagnosis not present

## 2022-05-29 LAB — SARS CORONAVIRUS 2 BY RT PCR: SARS Coronavirus 2 by RT PCR: NEGATIVE

## 2022-05-29 MED ORDER — AZITHROMYCIN 250 MG PO TABS: 250.0000 mg | ORAL_TABLET | Freq: Every day | ORAL | 0 refills | Status: DC

## 2022-05-29 MED ORDER — BENZONATATE 100 MG PO CAPS: 100.0000 mg | ORAL_CAPSULE | Freq: Three times a day (TID) | ORAL | 0 refills | Status: DC

## 2022-05-29 NOTE — ED Triage Notes (Signed)
Cough and SOB x 5 days. Nausea and ingestion earlier this week none noted at this time.

## 2022-05-29 NOTE — ED Notes (Signed)
Dc instructions and scripts reviewed with pt. No questions or concerns at this time.

## 2022-05-29 NOTE — ED Provider Notes (Signed)
[facility] EMERGENCY DEPARTMENT Provider Note   CSN: [id number] Arrival date & time: 05/29/22  1132     History  Chief Complaint  Patient presents with   Cough   Shortness of Breath    Shannon Haney is a 71 y.o. female.  She has a history of hypertension.  She just returned from a trip to [place] on a cruise.  She started with nasal congestion and sneezing 8 days ago and for the last 5 days has had a nonproductive cough subjective fevers and indigestion.  No sick contacts.  Has tried nothing for her symptoms.  She is COVID vaccinated.  The history is provided by the patient.  Cough Cough characteristics:  Non-productive Sputum characteristics:  Nondescript Severity:  Moderate Onset quality:  Gradual Duration:  5 days Timing:  Intermittent Progression:  Unchanged Chronicity:  New Smoker: no   Context: not sick contacts   Relieved by:  None tried Worsened by:  Nothing Ineffective treatments:  None tried Associated symptoms: fever, headaches, rhinorrhea and shortness of breath   Associated symptoms: no chest pain and no rash   Risk factors: recent travel   Shortness of Breath Associated symptoms: cough, fever and headaches   Associated symptoms: no chest pain and no rash   Risk factors: no tobacco use        Home Medications Prior to Admission medications   Medication Sig Start Date End Date Taking? Authorizing Provider  Acetaminophen (TYLENOL EXTRA STRENGTH PO) Take 1,000 mg by mouth every 8 (eight) hours as needed (pain).     [provider]  aspirin EC 81 MG EC tablet Take 1 tablet (81 mg total) by mouth daily. 01/03/19   Norval Morton, MD  diphenhydramine-acetaminophen (TYLENOL PM) 25-500 MG TABS Take 1 tablet by mouth at bedtime as needed (sleep).     [provider]  escitalopram (LEXAPRO) 10 MG tablet Take 10 mg by mouth daily.    [provider]  lisinopril (PRINIVIL,ZESTRIL) 20 MG tablet Take 20 mg by mouth daily.      [provider]  simvastatin (ZOCOR) 20 MG tablet Take 20 mg by mouth daily at 6 PM.     [provider]      Allergies    Sulfa antibiotics    Review of Systems   Review of Systems  Constitutional:  Positive for fever.  HENT:  Positive for rhinorrhea.   Eyes:  Negative for visual disturbance.  Respiratory:  Positive for cough and shortness of breath.   Cardiovascular:  Negative for chest pain.  Gastrointestinal:  Positive for nausea.  Skin:  Negative for rash.  Neurological:  Positive for headaches.    Physical Exam Updated Vital Signs BP (!) 166/87   Pulse 94   Temp 98.5 F (36.9 C) (Oral)   Resp 20   Ht '5\' 8"'$  (1.727 m)   Wt 76.7 kg   SpO2 96%   BMI 25.70 kg/m  Physical Exam Vitals and nursing note reviewed.  Constitutional:      General: She is not in acute distress.    Appearance: Normal appearance. She is well-developed.  HENT:     Head: Normocephalic and atraumatic.     Nose: Congestion present.  Eyes:     Conjunctiva/sclera: Conjunctivae normal.  Cardiovascular:     Rate and Rhythm: Normal rate and regular rhythm.     Heart sounds: No murmur heard. Pulmonary:     Effort: Pulmonary effort is normal. No respiratory distress.  Breath sounds: Normal breath sounds.  Abdominal:     Palpations: Abdomen is soft.     Tenderness: There is no abdominal tenderness. There is no guarding or rebound.  Musculoskeletal:     Cervical back: Neck supple.     Right lower leg: No edema.     Left lower leg: No edema.  Skin:    General: Skin is warm and dry.     Capillary Refill: Capillary refill takes less than 2 seconds.  Neurological:     General: No focal deficit present.     Mental Status: She is alert.     ED Results / Procedures / Treatments   Labs (all labs ordered are listed, but only abnormal results are displayed) Labs Reviewed  SARS CORONAVIRUS 2 BY RT PCR    EKG EKG Interpretation  Date/Time:  Saturday May 29 2022 12:16:19  EDT Ventricular Rate:  87 PR Interval:  181 QRS Duration: 100 QT Interval:  387 QTC Calculation: 466 R Axis:   -5 Text Interpretation: Sinus rhythm rate increased from prior 3/20 Confirmed by Aletta Edouard [phone]) on 05/29/2022 12:23:53 PM  Radiology DG Chest Port 1 View  Result Date: 05/29/2022 CLINICAL DATA:  Cough and shortness of breath for 5 days. EXAM: PORTABLE CHEST 1 VIEW COMPARISON:  January 01, 2019 FINDINGS: Cardiomediastinal silhouette is normal. Mediastinal contours appear intact. There is no evidence of pleural effusion or pneumothorax. Minimal peribronchial thickening in the right lung base. Osseous structures are without acute abnormality. Soft tissues are grossly normal. IMPRESSION: Minimal peribronchial thickening in the right lung base which may represent bronchitis or early/atypical bronchopneumonia. Electronically Signed   By: Fidela Salisbury M.D.   On: 05/29/2022 12:43    Procedures Procedures    Medications Ordered in ED Medications - No data to display  ED Course/ Medical Decision Making/ A&P Clinical Course as of 05/29/22 1728  Sat May 29, 2022  1247 Chest x-ray showing possible atelectasis right base.  Awaiting radiology reading. [MB]    Clinical Course User Index [MB] Hayden Rasmussen, MD                           Medical Decision Making Amount and/or Complexity of Data Reviewed Radiology: ordered.  Risk Prescription drug management.  Shannon Haney was evaluated in Emergency Department on 05/29/2022 for the symptoms described in the history of present illness. She was evaluated in the context of the global COVID-19 pandemic, which necessitated consideration that the patient might be at risk for infection with the SARS-CoV-2 virus that causes COVID-19. Institutional protocols and algorithms that pertain to the evaluation of patients at risk for COVID-19 are in a state of rapid change based on information released by regulatory bodies including the CDC  and federal and state organizations. These policies and algorithms were followed during the patient's care in the ED. This patient complains of cough shortness of breath headache nausea indigestion; this involves an extensive number of treatment Options and is a complaint that carries with it a high risk of complications and morbidity. The differential includes COVID, flu, pneumonia, bronchitis, ACS  I ordered, reviewed and interpreted labs, which included COVID test which was negative  I ordered imaging studies which included chest x-ray and I independently    visualized and interpreted imaging which showed peribronchial thickening Additional history obtained from patient's daughter Previous records obtained and reviewed in epic no recent admissions  Cardiac monitoring reviewed, normal sinus rhythm  Social determinants considered, no significant barriers Critical Interventions: None  After the interventions stated above, I reevaluated the patient and found patient to be oxygenating well and well-appearing Admission and further testing considered, no indications for admission at this time.  Will start on antibiotics and cough medicine for acute bronchitis.  Return instructions discussed          Final Clinical Impression(s) / ED Diagnoses Final diagnoses:  Bronchitis    Rx / DC Orders ED Discharge Orders          Ordered    azithromycin (ZITHROMAX) 250 MG tablet  Daily        05/29/22 1255    benzonatate (TESSALON) 100 MG capsule  Every 8 hours        05/29/22 1255              Hayden Rasmussen, MD 05/29/22 1730

## 2022-05-29 NOTE — Discharge Instructions (Signed)
You were seen in the emergency department for cough fever shortness of breath.  Your EKG was unremarkable and your COVID test was negative.  Your chest x-ray showed possible bronchitis.  We are starting you on an antibiotic and some cough medication.  Please stay well-hydrated and rest.  Follow-up with your regular doctor.  Return to the emergency department if any worsening or concerning symptoms.

## 2022-05-29 NOTE — ED Notes (Signed)
Ambulated from triage to room 11 quick pace, SpO2 94-98%, HR max 105.

## 2022-06-14 DIAGNOSIS — D2262 Melanocytic nevi of left upper limb, including shoulder: Secondary | ICD-10-CM | POA: Diagnosis not present

## 2022-06-14 DIAGNOSIS — L80 Vitiligo: Secondary | ICD-10-CM | POA: Diagnosis not present

## 2022-06-14 DIAGNOSIS — Z85828 Personal history of other malignant neoplasm of skin: Secondary | ICD-10-CM | POA: Diagnosis not present

## 2022-06-14 DIAGNOSIS — S40862A Insect bite (nonvenomous) of left upper arm, initial encounter: Secondary | ICD-10-CM | POA: Diagnosis not present

## 2022-06-14 DIAGNOSIS — L738 Other specified follicular disorders: Secondary | ICD-10-CM | POA: Diagnosis not present

## 2022-06-14 DIAGNOSIS — S40861A Insect bite (nonvenomous) of right upper arm, initial encounter: Secondary | ICD-10-CM | POA: Diagnosis not present

## 2022-06-14 DIAGNOSIS — L821 Other seborrheic keratosis: Secondary | ICD-10-CM | POA: Diagnosis not present

## 2022-08-20 DIAGNOSIS — E039 Hypothyroidism, unspecified: Secondary | ICD-10-CM | POA: Diagnosis not present

## 2022-08-20 DIAGNOSIS — G4733 Obstructive sleep apnea (adult) (pediatric): Secondary | ICD-10-CM | POA: Diagnosis not present

## 2022-08-20 DIAGNOSIS — G4700 Insomnia, unspecified: Secondary | ICD-10-CM | POA: Diagnosis not present

## 2022-08-20 DIAGNOSIS — F3341 Major depressive disorder, recurrent, in partial remission: Secondary | ICD-10-CM | POA: Diagnosis not present

## 2022-08-20 DIAGNOSIS — N182 Chronic kidney disease, stage 2 (mild): Secondary | ICD-10-CM | POA: Diagnosis not present

## 2022-08-20 DIAGNOSIS — I129 Hypertensive chronic kidney disease with stage 1 through stage 4 chronic kidney disease, or unspecified chronic kidney disease: Secondary | ICD-10-CM | POA: Diagnosis not present

## 2022-08-20 DIAGNOSIS — E782 Mixed hyperlipidemia: Secondary | ICD-10-CM | POA: Diagnosis not present

## 2022-08-20 DIAGNOSIS — Z8249 Family history of ischemic heart disease and other diseases of the circulatory system: Secondary | ICD-10-CM | POA: Diagnosis not present

## 2022-08-20 DIAGNOSIS — Z0000 Encounter for general adult medical examination without abnormal findings: Secondary | ICD-10-CM | POA: Diagnosis not present

## 2022-11-29 DIAGNOSIS — H5203 Hypermetropia, bilateral: Secondary | ICD-10-CM | POA: Diagnosis not present

## 2022-11-29 DIAGNOSIS — H25011 Cortical age-related cataract, right eye: Secondary | ICD-10-CM | POA: Diagnosis not present

## 2022-11-29 DIAGNOSIS — H2513 Age-related nuclear cataract, bilateral: Secondary | ICD-10-CM | POA: Diagnosis not present

## 2022-12-09 ENCOUNTER — Other Ambulatory Visit: Payer: Self-pay | Admitting: Family Medicine

## 2022-12-09 DIAGNOSIS — Z1231 Encounter for screening mammogram for malignant neoplasm of breast: Secondary | ICD-10-CM

## 2023-01-21 ENCOUNTER — Ambulatory Visit
Admission: RE | Admit: 2023-01-21 | Discharge: 2023-01-21 | Disposition: A | Discharge status: Home / Self Care | Payer: Medicare PPO | Source: Ambulatory Visit | Attending: Family Medicine | Admitting: Family Medicine

## 2023-01-21 DIAGNOSIS — Z1231 Encounter for screening mammogram for malignant neoplasm of breast: Secondary | ICD-10-CM | POA: Diagnosis not present

## 2023-07-28 DIAGNOSIS — R350 Frequency of micturition: Secondary | ICD-10-CM | POA: Diagnosis not present

## 2023-07-28 DIAGNOSIS — N3000 Acute cystitis without hematuria: Secondary | ICD-10-CM | POA: Diagnosis not present

## 2023-08-19 DIAGNOSIS — M170 Bilateral primary osteoarthritis of knee: Secondary | ICD-10-CM | POA: Diagnosis not present

## 2023-08-26 ENCOUNTER — Other Ambulatory Visit (HOSPITAL_BASED_OUTPATIENT_CLINIC_OR_DEPARTMENT_OTHER): Payer: Self-pay | Admitting: Family Medicine

## 2023-08-26 DIAGNOSIS — J309 Allergic rhinitis, unspecified: Secondary | ICD-10-CM | POA: Diagnosis not present

## 2023-08-26 DIAGNOSIS — E039 Hypothyroidism, unspecified: Secondary | ICD-10-CM | POA: Diagnosis not present

## 2023-08-26 DIAGNOSIS — I129 Hypertensive chronic kidney disease with stage 1 through stage 4 chronic kidney disease, or unspecified chronic kidney disease: Secondary | ICD-10-CM | POA: Diagnosis not present

## 2023-08-26 DIAGNOSIS — Z8249 Family history of ischemic heart disease and other diseases of the circulatory system: Secondary | ICD-10-CM | POA: Diagnosis not present

## 2023-08-26 DIAGNOSIS — Z0000 Encounter for general adult medical examination without abnormal findings: Secondary | ICD-10-CM | POA: Diagnosis not present

## 2023-08-26 DIAGNOSIS — G4733 Obstructive sleep apnea (adult) (pediatric): Secondary | ICD-10-CM | POA: Diagnosis not present

## 2023-08-26 DIAGNOSIS — E782 Mixed hyperlipidemia: Secondary | ICD-10-CM

## 2023-08-26 DIAGNOSIS — F3340 Major depressive disorder, recurrent, in remission, unspecified: Secondary | ICD-10-CM | POA: Diagnosis not present

## 2023-08-26 DIAGNOSIS — Z131 Encounter for screening for diabetes mellitus: Secondary | ICD-10-CM | POA: Diagnosis not present

## 2023-08-26 DIAGNOSIS — N182 Chronic kidney disease, stage 2 (mild): Secondary | ICD-10-CM | POA: Diagnosis not present

## 2023-08-26 DIAGNOSIS — R6889 Other general symptoms and signs: Secondary | ICD-10-CM | POA: Diagnosis not present

## 2023-09-20 DIAGNOSIS — L570 Actinic keratosis: Secondary | ICD-10-CM | POA: Diagnosis not present

## 2023-09-20 DIAGNOSIS — L738 Other specified follicular disorders: Secondary | ICD-10-CM | POA: Diagnosis not present

## 2023-09-20 DIAGNOSIS — D2262 Melanocytic nevi of left upper limb, including shoulder: Secondary | ICD-10-CM | POA: Diagnosis not present

## 2023-09-20 DIAGNOSIS — D225 Melanocytic nevi of trunk: Secondary | ICD-10-CM | POA: Diagnosis not present

## 2023-09-20 DIAGNOSIS — Z85828 Personal history of other malignant neoplasm of skin: Secondary | ICD-10-CM | POA: Diagnosis not present

## 2023-09-20 DIAGNOSIS — L80 Vitiligo: Secondary | ICD-10-CM | POA: Diagnosis not present

## 2023-09-20 DIAGNOSIS — B002 Herpesviral gingivostomatitis and pharyngotonsillitis: Secondary | ICD-10-CM | POA: Diagnosis not present

## 2023-09-20 DIAGNOSIS — L814 Other melanin hyperpigmentation: Secondary | ICD-10-CM | POA: Diagnosis not present

## 2023-09-20 DIAGNOSIS — L821 Other seborrheic keratosis: Secondary | ICD-10-CM | POA: Diagnosis not present

## 2023-09-30 ENCOUNTER — Ambulatory Visit (HOSPITAL_BASED_OUTPATIENT_CLINIC_OR_DEPARTMENT_OTHER)
Admission: RE | Admit: 2023-09-30 | Discharge: 2023-09-30 | Disposition: A | Discharge status: Home / Self Care | Payer: Medicare PPO | Source: Ambulatory Visit | Attending: Family Medicine | Admitting: Family Medicine

## 2023-09-30 DIAGNOSIS — E782 Mixed hyperlipidemia: Secondary | ICD-10-CM | POA: Insufficient documentation

## 2023-10-03 ENCOUNTER — Other Ambulatory Visit: Payer: Self-pay | Admitting: Physician Assistant

## 2023-10-03 DIAGNOSIS — R197 Diarrhea, unspecified: Secondary | ICD-10-CM | POA: Diagnosis not present

## 2023-10-03 DIAGNOSIS — R109 Unspecified abdominal pain: Secondary | ICD-10-CM | POA: Diagnosis not present

## 2023-10-03 DIAGNOSIS — R946 Abnormal results of thyroid function studies: Secondary | ICD-10-CM | POA: Diagnosis not present

## 2023-10-04 DIAGNOSIS — R197 Diarrhea, unspecified: Secondary | ICD-10-CM | POA: Diagnosis not present

## 2023-10-07 ENCOUNTER — Ambulatory Visit
Admission: RE | Admit: 2023-10-07 | Discharge: 2023-10-07 | Disposition: A | Discharge status: Home / Self Care | Payer: Medicare PPO | Source: Ambulatory Visit | Attending: Physician Assistant | Admitting: Physician Assistant

## 2023-10-07 DIAGNOSIS — R109 Unspecified abdominal pain: Secondary | ICD-10-CM

## 2023-10-07 DIAGNOSIS — R599 Enlarged lymph nodes, unspecified: Secondary | ICD-10-CM | POA: Diagnosis not present

## 2023-10-07 DIAGNOSIS — R197 Diarrhea, unspecified: Secondary | ICD-10-CM | POA: Diagnosis not present

## 2023-10-07 MED ORDER — IOPAMIDOL (ISOVUE-300) INJECTION 61%
100.0000 mL | Freq: Once | INTRAVENOUS | Status: AC | PRN
  Administered 2023-10-07: 100 mL via INTRAVENOUS

## 2023-10-10 ENCOUNTER — Other Ambulatory Visit (HOSPITAL_COMMUNITY): Payer: Self-pay | Admitting: Family Medicine

## 2023-10-10 DIAGNOSIS — R590 Localized enlarged lymph nodes: Secondary | ICD-10-CM

## 2023-10-10 DIAGNOSIS — R9389 Abnormal findings on diagnostic imaging of other specified body structures: Secondary | ICD-10-CM

## 2023-10-13 DIAGNOSIS — R109 Unspecified abdominal pain: Secondary | ICD-10-CM | POA: Diagnosis not present

## 2023-10-13 DIAGNOSIS — R197 Diarrhea, unspecified: Secondary | ICD-10-CM | POA: Diagnosis not present

## 2023-10-15 DIAGNOSIS — M25561 Pain in right knee: Secondary | ICD-10-CM | POA: Diagnosis not present

## 2023-10-15 DIAGNOSIS — M25562 Pain in left knee: Secondary | ICD-10-CM | POA: Diagnosis not present

## 2023-10-15 DIAGNOSIS — R269 Unspecified abnormalities of gait and mobility: Secondary | ICD-10-CM | POA: Diagnosis not present

## 2023-10-15 DIAGNOSIS — M1990 Unspecified osteoarthritis, unspecified site: Secondary | ICD-10-CM | POA: Diagnosis not present

## 2023-10-20 ENCOUNTER — Ambulatory Visit (HOSPITAL_COMMUNITY)
Admission: RE | Admit: 2023-10-20 | Discharge: 2023-10-20 | Disposition: A | Discharge status: Home / Self Care | Payer: Medicare PPO | Source: Ambulatory Visit | Attending: Family Medicine | Admitting: Family Medicine

## 2023-10-20 DIAGNOSIS — R9389 Abnormal findings on diagnostic imaging of other specified body structures: Secondary | ICD-10-CM | POA: Diagnosis not present

## 2023-10-20 DIAGNOSIS — R590 Localized enlarged lymph nodes: Secondary | ICD-10-CM | POA: Diagnosis not present

## 2023-10-20 LAB — GLUCOSE, CAPILLARY: Glucose-Capillary: 93 mg/dL (ref 70–99)

## 2023-10-20 MED ORDER — FLUDEOXYGLUCOSE F - 18 (FDG) INJECTION
8.7600 | Freq: Once | INTRAVENOUS | Status: AC
  Administered 2023-10-20: 8.76 via INTRAVENOUS

## 2023-10-24 ENCOUNTER — Inpatient Hospital Stay: Payer: Medicare PPO | Attending: Physician Assistant | Admitting: Physician Assistant

## 2023-10-24 ENCOUNTER — Inpatient Hospital Stay: Payer: Medicare PPO

## 2023-10-24 VITALS — BP 158/96 | HR 85 | Temp 98.0°F | Resp 15 | Wt 179.6 lb

## 2023-10-24 DIAGNOSIS — R591 Generalized enlarged lymph nodes: Secondary | ICD-10-CM | POA: Diagnosis present

## 2023-10-24 LAB — CBC WITH DIFFERENTIAL (CANCER CENTER ONLY)
Abs Immature Granulocytes: 0.01 10*3/uL (ref 0.00–0.07)
Basophils Absolute: 0.1 10*3/uL (ref 0.0–0.1)
Basophils Relative: 1 %
Eosinophils Absolute: 0.4 10*3/uL (ref 0.0–0.5)
Eosinophils Relative: 7 %
HCT: 40 % (ref 36.0–46.0)
Hemoglobin: 13.7 g/dL (ref 12.0–15.0)
Immature Granulocytes: 0 %
Lymphocytes Relative: 26 %
Lymphs Abs: 1.4 10*3/uL (ref 0.7–4.0)
MCH: 30.3 pg (ref 26.0–34.0)
MCHC: 34.3 g/dL (ref 30.0–36.0)
MCV: 88.5 fL (ref 80.0–100.0)
Monocytes Absolute: 0.4 10*3/uL (ref 0.1–1.0)
Monocytes Relative: 7 %
Neutro Abs: 3.3 10*3/uL (ref 1.7–7.7)
Neutrophils Relative %: 59 %
Platelet Count: 213 10*3/uL (ref 150–400)
RBC: 4.52 MIL/uL (ref 3.87–5.11)
RDW: 12.5 % (ref 11.5–15.5)
WBC Count: 5.6 10*3/uL (ref 4.0–10.5)
nRBC: 0 % (ref 0.0–0.2)

## 2023-10-24 LAB — CMP (CANCER CENTER ONLY)
ALT: 25 U/L (ref 0–44)
AST: 25 U/L (ref 15–41)
Albumin: 4.7 g/dL (ref 3.5–5.0)
Alkaline Phosphatase: 50 U/L (ref 38–126)
Anion gap: 8 (ref 5–15)
BUN: 14 mg/dL (ref 8–23)
CO2: 27 mmol/L (ref 22–32)
Calcium: 9.9 mg/dL (ref 8.9–10.3)
Chloride: 104 mmol/L (ref 98–111)
Creatinine: 0.77 mg/dL (ref 0.44–1.00)
GFR, Estimated: >60 mL/min (ref >60)
Glucose, Bld: 80 mg/dL (ref 70–99)
Potassium: 4.1 mmol/L (ref 3.5–5.1)
Sodium: 139 mmol/L (ref 135–145)
Total Bilirubin: 0.4 mg/dL (ref 0.0–1.2)
Total Protein: 7.9 g/dL (ref 6.5–8.1)

## 2023-10-24 LAB — HEPATITIS B SURFACE ANTIGEN: Hepatitis B Surface Ag: NONREACTIVE

## 2023-10-24 LAB — HEPATITIS B SURFACE ANTIBODY,QUALITATIVE: Hep B S Ab: NONREACTIVE

## 2023-10-24 LAB — HEPATITIS B CORE ANTIBODY, TOTAL: Hep B Core Total Ab: NONREACTIVE

## 2023-10-24 LAB — HEPATITIS C ANTIBODY: HCV Ab: NONREACTIVE

## 2023-10-24 LAB — LACTATE DEHYDROGENASE: LDH: 158 U/L (ref 98–192)

## 2023-10-24 LAB — HIV ANTIBODY (ROUTINE TESTING W REFLEX): HIV Screen 4th Generation wRfx: NONREACTIVE

## 2023-10-24 LAB — SEDIMENTATION RATE: Sed Rate: 22 mm/h (ref 0–22)

## 2023-10-24 LAB — C-REACTIVE PROTEIN: CRP: 1.3 mg/dL — ABNORMAL HIGH (ref <1.0)

## 2023-10-24 NOTE — Patient Instructions (Addendum)
Diagnostic Clinic Office Visit Discharge Information and Instructions  Thank you for choosing [facility] for your healthcare needs.  Below is a summary of today's discussion, along with our contact information and an outline of what to expect next.  Reason for Visit:  enlarged pelvic lymph nodes  Proposed Diagnostic Care Plan: Labs ordered today Referral sent to [facility] surgery for lymph node biopsy. They will call you to schedule a consult. We will watch for a biopsy to be scheduled.   What to Expect: - Generally, when lab tests are ordered the results can take up to 1 week for results to be available.  At that point, we will contact you to discuss your results with you.  Unless there is a critical result, we will typically wait for all of your lab results to be available before contacting you. - If a biopsy is part of your Care Plan, those results can take on average 7-10 days to result.  Once results are available, we will contact you to discuss your pathology results and any next steps. - If you have additional imaging ordered, such as a CT Scan, MRI, Ultrasound, Bone Scan, or PET scan, your imaging will need to be authorized then scheduled with the earliest available appointment.  You may be asked to travel to another hospital within [facility] who has a sooner availability, please consider doing so if asked. - If you use MyChart, your results will be available to you in the MyChart portal.  Your provider will be in touch with you as soon as all of your results are available to be discussed.  Your Diagnostic Clinic Provider: Namon Cirri PA-C and Dr. Leonides Schanz  If you or your caregiver have number blocking on your cell phones, please ensure the cancer center's numbers are not blocked.  If you are not a registered MyChart user, please consider enrolling in MyChart to receive your test results and visit notes.  You can also access your discharge instructions electronically.   MyChart also gives you an electronic means to communicate with your Care Team instead of needing to call in to the cancer center.  We appreciate you trusting [place] with your healthcare and look forward to partnering with you as we work to uncover what your potential diagnosis may be.  Please do not hesitate to reach out at any point with questions or concerns.

## 2023-10-27 ENCOUNTER — Encounter: Payer: Self-pay | Admitting: *Deleted

## 2023-10-27 NOTE — Progress Notes (Unsigned)
 From: Luverne Aran, MD  Sent: 10/27/2023   9:19 AM EST  To: Rosina JONETTA Edison  Subject: RE: IR guided lymph node biopsy                PROCEDURE / BIOPSY REVIEW  Date: 10/27/23   Requested Biopsy site: Right external iliac lymph node  Reason for request: Lymphadenopathy  Imaging review: Best seen on CT and PET   Decision: Approved  Imaging modality to perform: Ultrasound. IR performing may prefer CT.  Schedule with: Moderate Sedation  Schedule for: Any VIR   Additional comments:  @Schedulers . Schedule with both US  and CT available. Thanks.   Please contact me with questions, concerns, or if issue pertaining to this request arise.   Aran ONEIDA Luverne, MD  [facility]  [facility]

## 2023-10-31 ENCOUNTER — Encounter: Payer: Self-pay | Admitting: Medical Oncology

## 2023-11-07 IMAGING — MG DIGITAL SCREENING BREAST BILAT IMPLANT W/ TOMO W/ CAD
9 of 12 series · 9 of 28 positions shown · non-contrast
Comparison: Previous exam(s).

CLINICAL DATA: Screening.

EXAM:
DIGITAL SCREENING BILATERAL MAMMOGRAM WITH IMPLANTS, CAD AND
TOMOSYNTHESIS
TECHNIQUE: Bilateral screening digital craniocaudal and mediolateral oblique
mammograms were obtained. Bilateral screening digital breast
tomosynthesis was performed. The images were evaluated with
computer-aided detection. Standard and/or implant displaced views
were performed.

[R MLO]
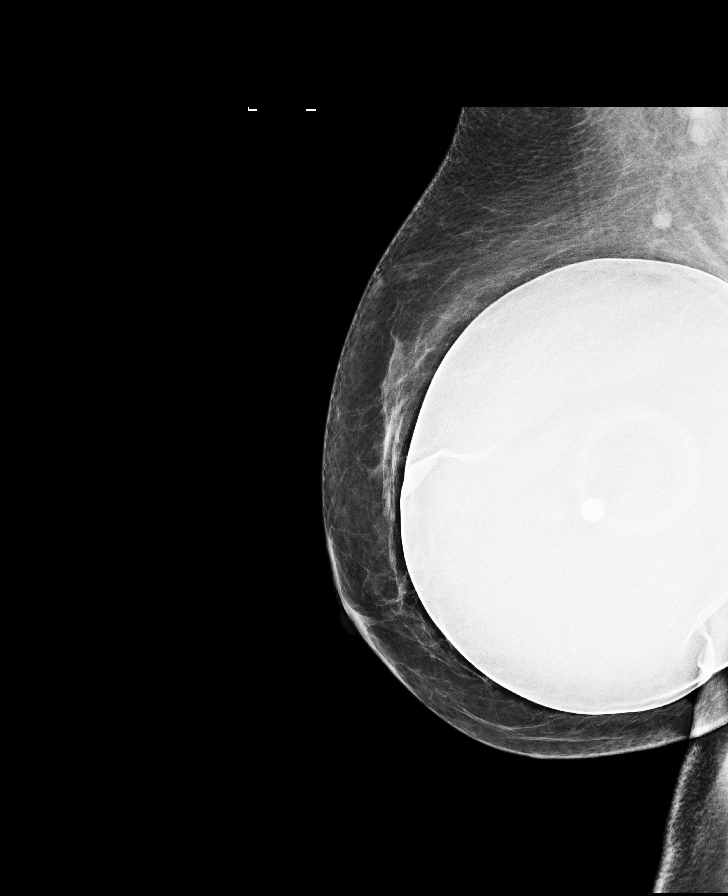

[L CC]
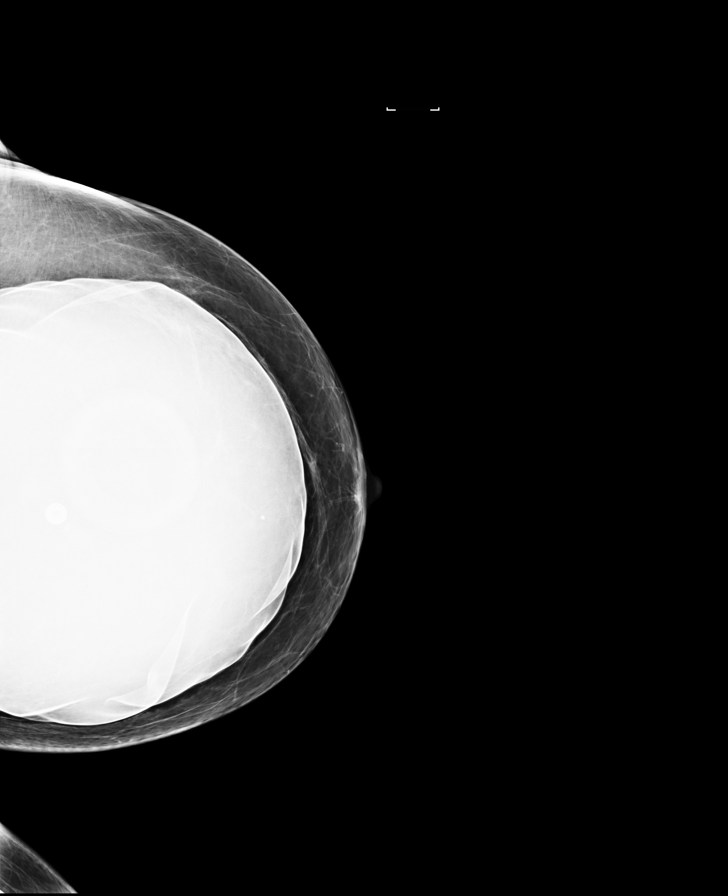

[L MLO]
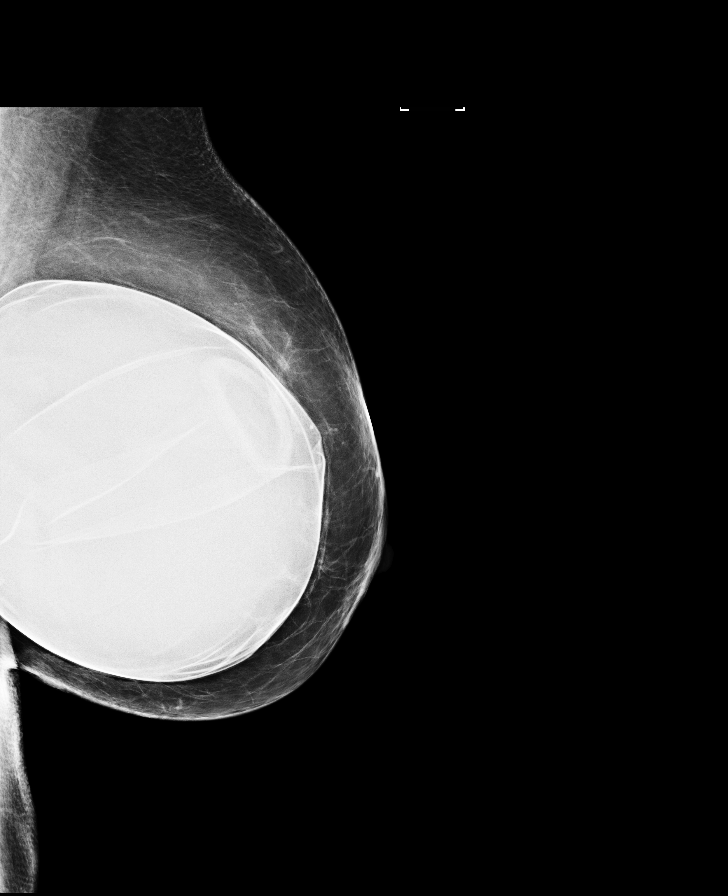

[R CC]
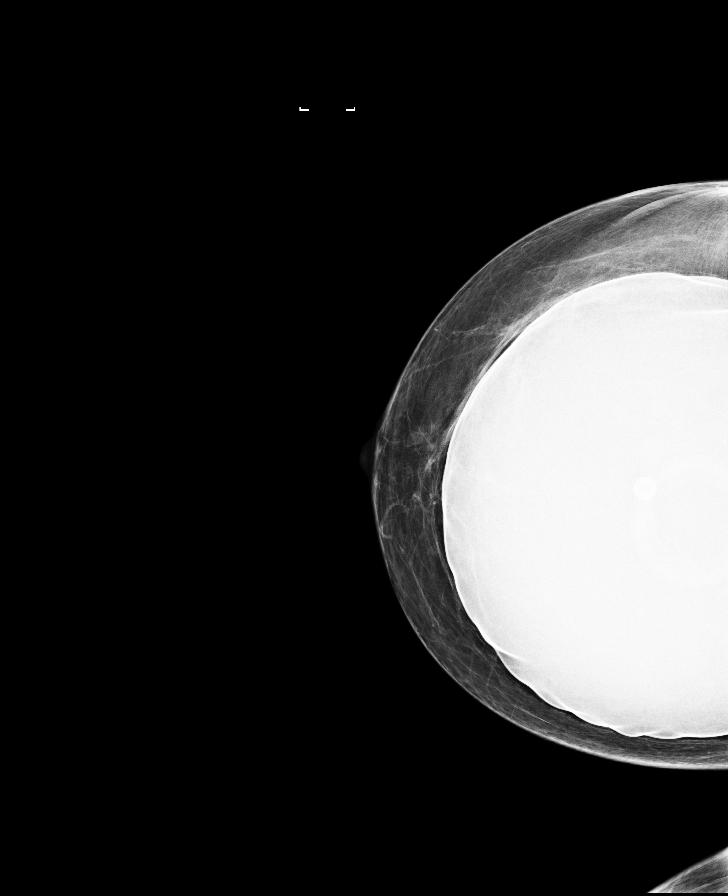

[R CC synth-2D]
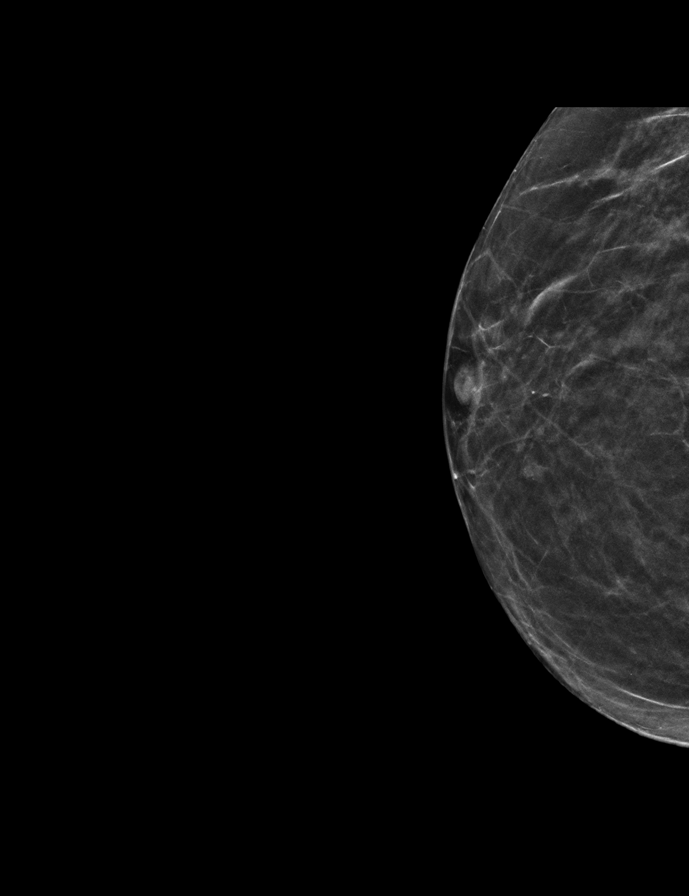

[R MLO synth-2D]
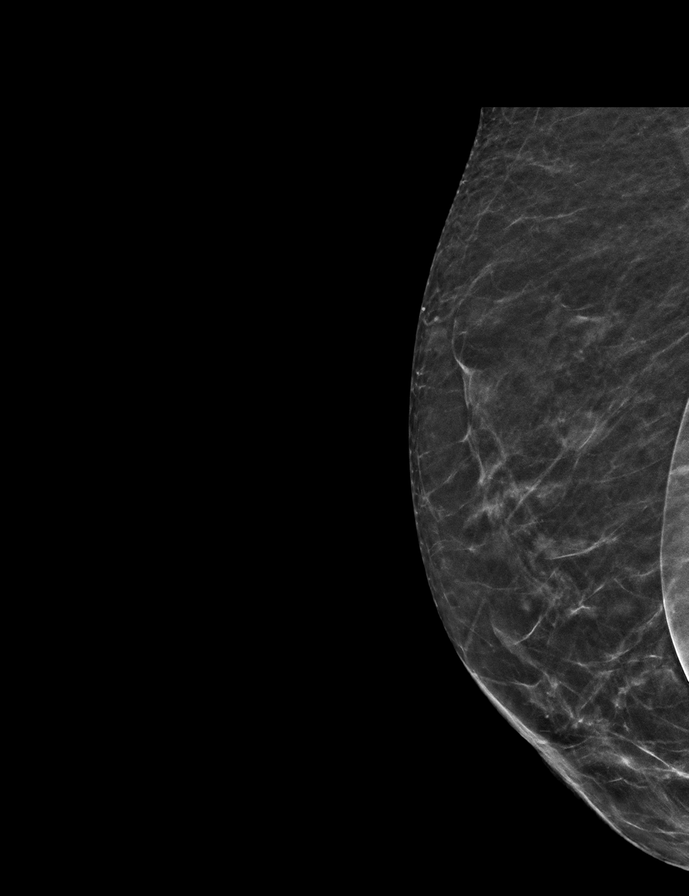

[L MLO synth-2D]
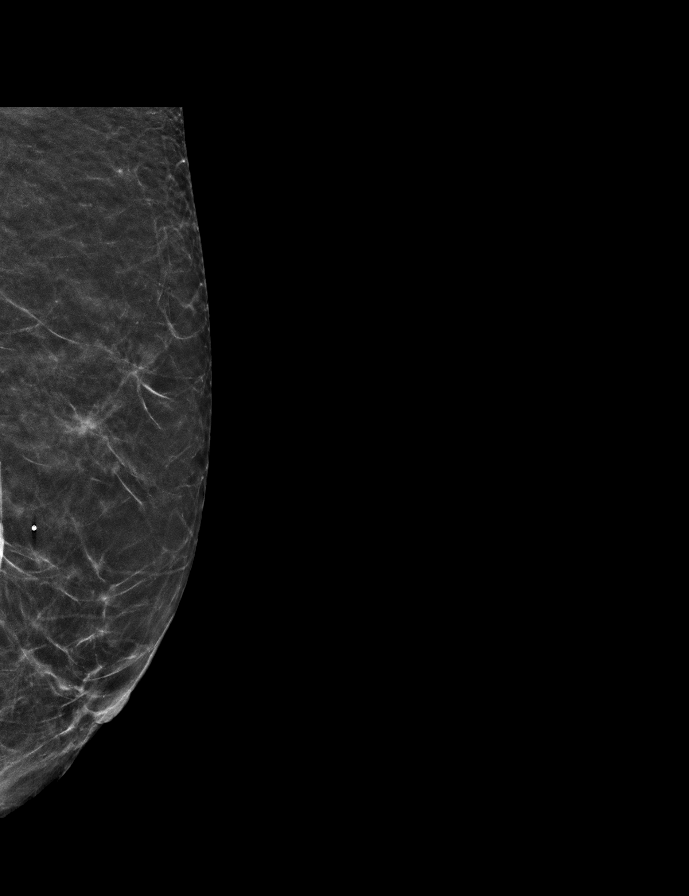

[L CC synth-2D]
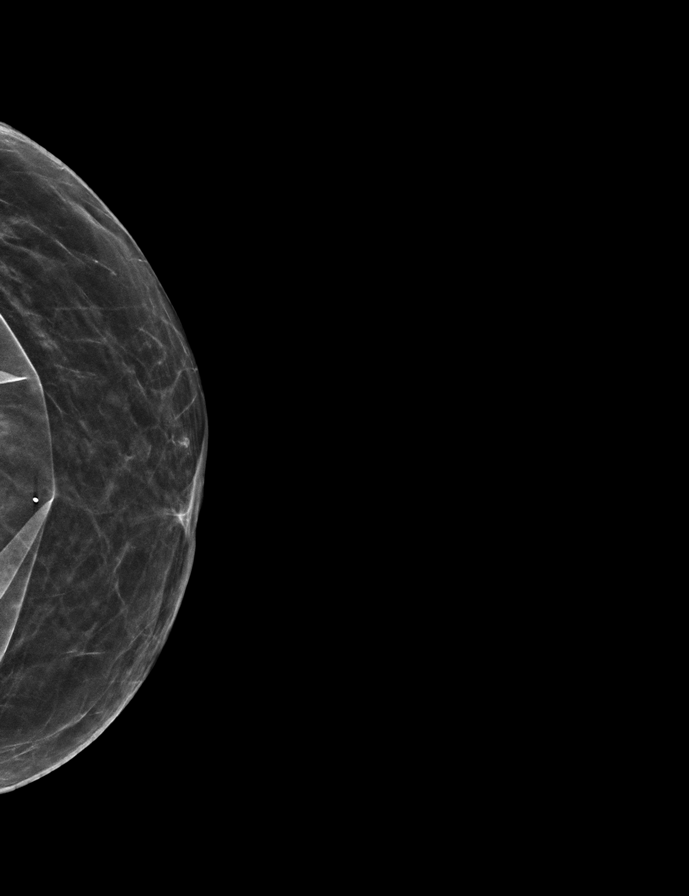

[L CCID BREAST TOMOSYNTHESIS IMAGE tomo · tomo slice 22/43.0]
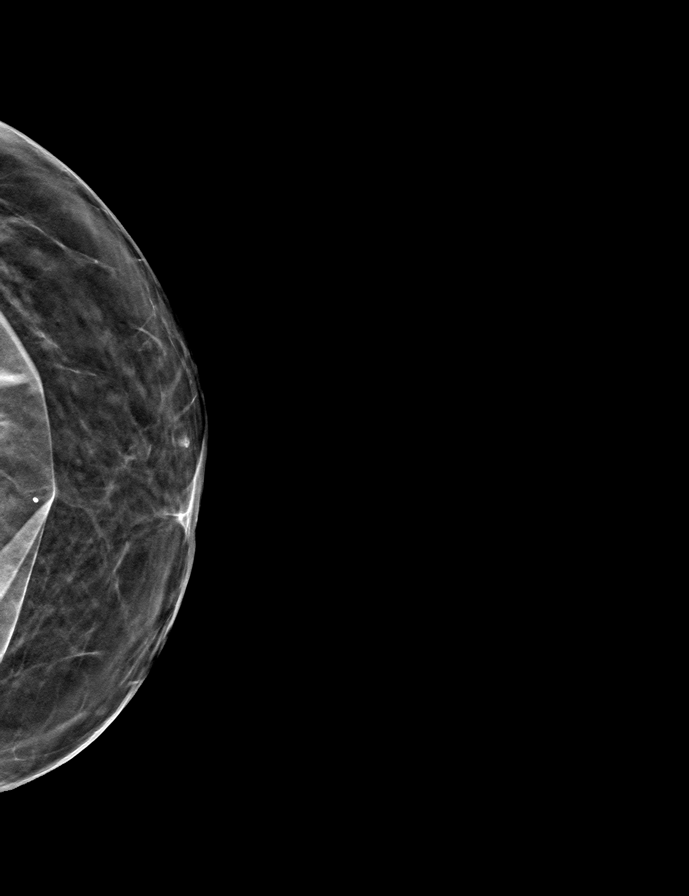

[9 of 28 positions shown; findings below may reference images not displayed]

ACR Breast Density Category b: There are scattered areas of
fibroglandular density.
FINDINGS: The patient has retroglandular implants. There are no findings
suspicious for malignancy.
IMPRESSION: No mammographic evidence of malignancy. A result letter of this
screening mammogram will be mailed directly to the patient.

RECOMMENDATION:
Screening mammogram in one year. (Code:ST-V-2PT)

BI-RADS CATEGORY  1:  Negative.

## 2023-11-11 ENCOUNTER — Other Ambulatory Visit: Payer: Self-pay | Admitting: Radiology

## 2023-11-11 DIAGNOSIS — R591 Generalized enlarged lymph nodes: Secondary | ICD-10-CM

## 2023-11-14 ENCOUNTER — Ambulatory Visit (HOSPITAL_COMMUNITY)
Admission: RE | Admit: 2023-11-14 | Discharge: 2023-11-14 | Disposition: A | Discharge status: Home / Self Care | Payer: Medicare PPO | Source: Ambulatory Visit | Attending: Physician Assistant | Admitting: Physician Assistant

## 2023-11-14 ENCOUNTER — Encounter (HOSPITAL_COMMUNITY): Payer: Self-pay

## 2023-11-14 ENCOUNTER — Other Ambulatory Visit: Payer: Self-pay | Admitting: Radiology

## 2023-11-14 ENCOUNTER — Other Ambulatory Visit: Payer: Self-pay

## 2023-11-14 DIAGNOSIS — R896 Abnormal cytological findings in specimens from other organs, systems and tissues: Secondary | ICD-10-CM | POA: Insufficient documentation

## 2023-11-14 DIAGNOSIS — R591 Generalized enlarged lymph nodes: Secondary | ICD-10-CM | POA: Diagnosis not present

## 2023-11-14 DIAGNOSIS — I889 Nonspecific lymphadenitis, unspecified: Secondary | ICD-10-CM | POA: Diagnosis not present

## 2023-11-14 DIAGNOSIS — R590 Localized enlarged lymph nodes: Secondary | ICD-10-CM | POA: Diagnosis present

## 2023-11-14 LAB — CBC
HCT: 37 % (ref 36.0–46.0)
Hemoglobin: 12.6 g/dL (ref 12.0–15.0)
MCH: 30.5 pg (ref 26.0–34.0)
MCHC: 34.1 g/dL (ref 30.0–36.0)
MCV: 89.6 fL (ref 80.0–100.0)
Platelets: 199 10*3/uL (ref 150–400)
RBC: 4.13 MIL/uL (ref 3.87–5.11)
RDW: 12.8 % (ref 11.5–15.5)
WBC: 4.5 10*3/uL (ref 4.0–10.5)
nRBC: 0 % (ref 0.0–0.2)

## 2023-11-14 LAB — PROTIME-INR
INR: 1 (ref 0.8–1.2)
Prothrombin Time: 13.7 s (ref 11.4–15.2)

## 2023-11-14 MED ORDER — FENTANYL CITRATE (PF) 100 MCG/2ML IJ SOLN
INTRAMUSCULAR | Status: AC | PRN
  Administered 2023-11-14: 25 ug via INTRAVENOUS

## 2023-11-14 MED ORDER — MIDAZOLAM HCL 2 MG/2ML IJ SOLN
INTRAMUSCULAR | Status: AC | PRN
  Administered 2023-11-14: 1 mg via INTRAVENOUS

## 2023-11-14 MED ORDER — LIDOCAINE HCL (PF) 1 % IJ SOLN
INTRAMUSCULAR | Status: AC
  Filled 2023-11-14: qty 30

## 2023-11-14 MED ORDER — SODIUM CHLORIDE 0.9 % IV SOLN: INTRAVENOUS | Status: DC

## 2023-11-14 MED ORDER — FENTANYL CITRATE (PF) 100 MCG/2ML IJ SOLN
INTRAMUSCULAR | Status: AC
  Filled 2023-11-14: qty 2

## 2023-11-14 MED ORDER — MIDAZOLAM HCL 2 MG/2ML IJ SOLN
INTRAMUSCULAR | Status: AC
  Filled 2023-11-14: qty 2

## 2023-11-14 MED ORDER — LIDOCAINE HCL (PF) 1 % IJ SOLN
8.0000 mL | Freq: Once | INTRAMUSCULAR | Status: AC
  Administered 2023-11-14: 8 mL via INTRADERMAL

## 2023-11-14 NOTE — H&P (Addendum)
Chief Complaint: Patient was seen in consultation today for lymphadenopathy;+PET; for lymph node biopsy at the request of Walisiewicz,Kaitlyn E  Referring Physician(s): Shanon Ace  Supervising Physician: Irish Lack  Patient Status: [facility] - Out-pt  History of Present Illness: Shannon Haney is a 73 y.o. female   FULL Code per pt  Pt was seen for persistent diarrhea in Dec 2024 CT showing abnormal pelvic lymphadenopathy No cancer hx PET:  12/26 IMPRESSION: 1. Hypermetabolic pelvic and lower retroperitoneal lymph nodes, with index right external iliac node measuring 1.7 cm in short axis with maximum SUV of 14.4. 2. Hypermetabolic left axillary lymph node measuring 0.8 cm in short axis with maximum SUV of 8.5. 3. An obvious primary site is not identified, reason the possibility of lymphoproliferative disease. 4. Tonsillar activity, maximum SUV 8.1 on the right and 8.0 on the left, likely physiologic but technically nonspecific.  Request made for lymph node biopsy Approved and now scheduled for today  Past Medical History:  Diagnosis Date   Allergy    Depression    Hyperlipidemia    Hypertension    Seasonal allergies     Past Surgical History:  Procedure Laterality Date   AUGMENTATION MAMMAPLASTY Bilateral    replaced around 2000   BREAST ENHANCEMENT SURGERY     CHOLECYSTECTOMY     RIGHT HEART CATH Right    VAGINAL HYSTERECTOMY      Allergies: Sulfa antibiotics  Medications: Prior to Admission medications   Medication Sig Start Date End Date Taking? Authorizing Provider  calcium carbonate (OSCAL) 1500 (600 Ca) MG TABS tablet Take by mouth 2 (two) times daily with a meal.   Yes [provider]  co-enzyme Q-10 30 MG capsule Take 30 mg by mouth daily.   Yes [provider]  escitalopram (LEXAPRO) 10 MG tablet Take 10 mg by mouth daily.   Yes [provider]  lisinopril (PRINIVIL,ZESTRIL) 20 MG tablet Take 20 mg by  mouth daily.    Yes [provider]  Multiple Vitamin (MULTIVITAMIN) capsule Take 1 capsule by mouth daily.   Yes [provider]  simvastatin (ZOCOR) 20 MG tablet Take 20 mg by mouth daily at 6 PM.    Yes [provider]  Acetaminophen (TYLENOL EXTRA STRENGTH PO) Take 1,000 mg by mouth every 8 (eight) hours as needed (pain).     [provider]  aspirin EC 81 MG EC tablet Take 1 tablet (81 mg total) by mouth daily. 01/03/19   Clydie Braun, MD  azithromycin (ZITHROMAX) 250 MG tablet Take 1 tablet (250 mg total) by mouth daily. Take first 2 tablets together, then 1 every day until finished. 05/29/22   Terrilee Files, MD  benzonatate (TESSALON) 100 MG capsule Take 1 capsule (100 mg total) by mouth every 8 (eight) hours. 05/29/22   Terrilee Files, MD  diphenhydramine-acetaminophen (TYLENOL PM) 25-500 MG TABS Take 1 tablet by mouth at bedtime as needed (sleep).     [provider]     Family History  Problem Relation Age of Onset   Breast cancer Mother 4   Prostate cancer Father    Breast cancer Maternal Aunt 28   Breast cancer Maternal Aunt 35   Colon cancer Neg Hx    Esophageal cancer Neg Hx    Rectal cancer Neg Hx    Stomach cancer Neg Hx     Social History   Socioeconomic History   Marital status: Married    Spouse name: Not  on file   Number of children: Not on file   Years of education: Not on file   Highest education level: Not on file  Occupational History   Not on file  Tobacco Use   Smoking status: Never   Smokeless tobacco: Never  Substance and Sexual Activity   Alcohol use: Yes    Alcohol/week: 5.0 standard drinks of alcohol    Types: 3 Glasses of wine, 2 Standard drinks or equivalent per week   Drug use: No   Sexual activity: Not on file  Other Topics Concern   Not on file  Social History Narrative   Not on file   Social Drivers of Health   Financial Resource Strain: Not on file  Food Insecurity: Not on file   Transportation Needs: Not on file  Physical Activity: Not on file  Stress: Not on file  Social Connections: Not on file    Review of Systems: A 12 point ROS discussed and pertinent positives are indicated in the HPI above.  All other systems are negative.  Review of Systems  Constitutional:  Negative for activity change, fatigue and fever.  Respiratory:  Negative for cough and shortness of breath.   Cardiovascular:  Negative for chest pain.  Gastrointestinal:  Negative for abdominal pain, diarrhea and nausea.  Psychiatric/Behavioral:  Negative for behavioral problems and confusion.     Vital Signs: BP (!) 143/80   Pulse 67   Temp 97.6 F (36.4 C) (Oral)   Resp 18   Ht 5\' 8"  (1.727 m)   Wt 175 lb (79.4 kg)   SpO2 100%   BMI 26.61 kg/m   Advance Care Plan: The advanced care plan/surrogate decision maker was discussed at the time of visit and documented in the medical record.    Physical Exam Vitals reviewed.  HENT:     Mouth/Throat:     Mouth: Mucous membranes are moist.  Cardiovascular:     Rate and Rhythm: Normal rate and regular rhythm.     Heart sounds: Normal heart sounds.  Pulmonary:     Breath sounds: Normal breath sounds. No wheezing.  Abdominal:     Palpations: Abdomen is soft.     Tenderness: There is no abdominal tenderness.  Musculoskeletal:        General: Normal range of motion.  Skin:    General: Skin is warm.  Neurological:     Mental Status: She is alert and oriented to person, place, and time.  Psychiatric:        Behavior: Behavior normal.     Imaging: NM PET Image Initial (PI) Whole Body (F-18 FDG) Result Date: 10/21/2023 CLINICAL DATA:  Initial  treatment strategy for pelvic adenopathy. EXAM: NUCLEAR MEDICINE PET WHOLE BODY TECHNIQUE: 8.8 mCi F-18 FDG was injected intravenously. Full-ring PET imaging was performed from the head to foot after the radiotracer. CT data was obtained and used for attenuation correction and anatomic  localization. Fasting blood glucose: 93 mg/dl COMPARISON:  CT pelvis 10/07/2023 FINDINGS: Mediastinal blood pool activity: SUV max 2.0 HEAD/NECK: Tonsillar activity, maximum SUV 8.1 on the right and 8.0 on the left, likely physiologic but technically nonspecific. Incidental CT findings: none CHEST: 0.8 cm left axillary lymph node on image 96 series 4 has a maximum SUV of 8.5. Incidental CT findings: Bilateral breast implants. Atheromatous vascular calcifications in the thoracic aorta. Mild mosaic attenuation in the lungs, nonspecific. ABDOMEN/PELVIS: Hypermetabolic pelvic and lower retroperitoneal lymph nodes. Index right external iliac node has some faint internal calcifications  and measures 1.7 cm in short axis on image 211 series 4 with maximum SUV of 14.4. Index left external iliac node 0.6 cm in short axis on image 204 series 4 with maximum SUV of 6.2. Index small left periaortic lymph node 0.4 cm in short axis on image 177 series 4 with maximum SUV 4.3. Physiologic activity in bowel. Incidental CT findings: Cholecystectomy. Mild aortoiliac atherosclerotic vascular calcification. Hysterectomy. SKELETON: No significant abnormal hypermetabolic activity in this region. Incidental CT findings: No gross abnormality. EXTREMITIES: No significant abnormal hypermetabolic activity in this region. Incidental CT findings: Small bilateral knee effusions. IMPRESSION: 1. Hypermetabolic pelvic and lower retroperitoneal lymph nodes, with index right external iliac node measuring 1.7 cm in short axis with maximum SUV of 14.4. 2. Hypermetabolic left axillary lymph node measuring 0.8 cm in short axis with maximum SUV of 8.5. 3. An obvious primary site is not identified, reason the possibility of lymphoproliferative disease. 4. Tonsillar activity, maximum SUV 8.1 on the right and 8.0 on the left, likely physiologic but technically nonspecific. 5. Small bilateral knee effusions. 6. Mild mosaic attenuation in the lungs, nonspecific.  7. Aortic atherosclerosis. Aortic Atherosclerosis (ICD10-I70.0). Electronically Signed   By: Gaylyn Rong M.D.   On: 10/21/2023 08:23    Labs:  CBC: Recent Labs    10/24/23 1328 11/14/23 0627  WBC 5.6 4.5  HGB 13.7 12.6  HCT 40.0 37.0  PLT 213 199    COAGS: Recent Labs    11/14/23 0627  INR 1.0    BMP: Recent Labs    10/24/23 1328  NA 139  K 4.1  CL 104  CO2 27  GLUCOSE 80  BUN 14  CALCIUM 9.9  CREATININE 0.77  GFRNONAA >60    LIVER FUNCTION TESTS: Recent Labs    10/24/23 1328  BILITOT 0.4  AST 25  ALT 25  ALKPHOS 50  PROT 7.9  ALBUMIN 4.7    TUMOR MARKERS: No results for input(s): "AFPTM", "CEA", "CA199", "CHROMGRNA" in the last 8760 hours.  Assessment and Plan:  Scheduled for lymph node biopsy Risks and benefits of right iliac lymph node biopsy was discussed with the patient and/or patient's family including, but not limited to bleeding, infection, damage to adjacent structures or low yield requiring additional tests.  All of the questions were answered and there is agreement to proceed.  Consent signed and in chart.  Thank you for this interesting consult.  I greatly enjoyed meeting Shannon Haney and look forward to participating in their care.  A copy of this report was sent to the requesting provider on this date.  Electronically Signed: Robet Leu, PA-C 11/14/2023, 7:08 AM   I spent a total of  30 Minutes   in face to face in clinical consultation, greater than 50% of which was counseling/coordinating care for iliac lymph node bx

## 2023-11-14 NOTE — Procedures (Signed)
Interventional Radiology Procedure Note  Procedure: Ultrasound guided right iliac lymph node biopsy  Findings: Please refer to procedural dictation for full description. 18 ga core x4, samples split between saline and formalin.  Complications: None immediate  Estimated Blood Loss: <5 ml  Recommendations: 1 hour bedrest. Follow Pathology results.   Marliss Coots, MD

## 2023-11-22 LAB — SURGICAL PATHOLOGY

## 2023-11-23 ENCOUNTER — Telehealth: Payer: Self-pay | Admitting: Physician Assistant

## 2023-11-23 NOTE — Telephone Encounter (Signed)
I notified Shannon Haney by phone regarding lymph node biopsy results. 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 Patient will be scheduled with Dr. Leonides Schanz in 6 months for follow up . All of patient's questions were answered and she expressed understanding of the plan provided.

## 2023-11-24 ENCOUNTER — Telehealth: Payer: Self-pay | Admitting: Hematology and Oncology

## 2023-11-24 NOTE — Telephone Encounter (Signed)
Scheduled appointments per scheduling message. Patient is aware of the made appointments and will be mailed an appointment reminder.

## 2023-12-01 ENCOUNTER — Encounter: Payer: Self-pay | Admitting: Medical Oncology

## 2024-03-08 ENCOUNTER — Other Ambulatory Visit: Payer: Self-pay | Admitting: Family Medicine

## 2024-03-08 DIAGNOSIS — Z1231 Encounter for screening mammogram for malignant neoplasm of breast: Secondary | ICD-10-CM

## 2024-03-18 ENCOUNTER — Other Ambulatory Visit: Payer: Self-pay | Admitting: Medical Genetics

## 2024-03-21 ENCOUNTER — Ambulatory Visit

## 2024-03-21 ENCOUNTER — Ambulatory Visit
Admission: RE | Admit: 2024-03-21 | Discharge: 2024-03-21 | Disposition: A | Discharge status: Home / Self Care | Source: Ambulatory Visit | Attending: Family Medicine | Admitting: Family Medicine

## 2024-03-21 DIAGNOSIS — Z1231 Encounter for screening mammogram for malignant neoplasm of breast: Secondary | ICD-10-CM | POA: Diagnosis not present

## 2024-05-25 ENCOUNTER — Inpatient Hospital Stay: Payer: Medicare PPO

## 2024-05-25 ENCOUNTER — Other Ambulatory Visit: Payer: Self-pay | Admitting: Hematology and Oncology

## 2024-05-25 ENCOUNTER — Inpatient Hospital Stay: Payer: Medicare PPO | Attending: Hematology and Oncology | Admitting: Hematology and Oncology

## 2024-05-25 VITALS — BP 152/98 | HR 61 | Temp 97.2°F | Resp 13 | Wt 180.9 lb

## 2024-05-25 DIAGNOSIS — Z90710 Acquired absence of both cervix and uterus: Secondary | ICD-10-CM | POA: Diagnosis not present

## 2024-05-25 DIAGNOSIS — R590 Localized enlarged lymph nodes: Secondary | ICD-10-CM | POA: Insufficient documentation

## 2024-05-25 DIAGNOSIS — Z7982 Long term (current) use of aspirin: Secondary | ICD-10-CM | POA: Diagnosis not present

## 2024-05-25 DIAGNOSIS — I10 Essential (primary) hypertension: Secondary | ICD-10-CM | POA: Insufficient documentation

## 2024-05-25 DIAGNOSIS — Z882 Allergy status to sulfonamides status: Secondary | ICD-10-CM | POA: Insufficient documentation

## 2024-05-25 DIAGNOSIS — R591 Generalized enlarged lymph nodes: Secondary | ICD-10-CM

## 2024-05-25 DIAGNOSIS — Z79899 Other long term (current) drug therapy: Secondary | ICD-10-CM | POA: Diagnosis not present

## 2024-05-25 DIAGNOSIS — F32A Depression, unspecified: Secondary | ICD-10-CM | POA: Insufficient documentation

## 2024-05-25 DIAGNOSIS — Z803 Family history of malignant neoplasm of breast: Secondary | ICD-10-CM | POA: Insufficient documentation

## 2024-05-25 DIAGNOSIS — E785 Hyperlipidemia, unspecified: Secondary | ICD-10-CM | POA: Diagnosis not present

## 2024-05-25 LAB — CMP (CANCER CENTER ONLY)
ALT: 34 U/L (ref 0–44)
AST: 30 U/L (ref 15–41)
Albumin: 4.4 g/dL (ref 3.5–5.0)
Alkaline Phosphatase: 45 U/L (ref 38–126)
Anion gap: 5 (ref 5–15)
BUN: 20 mg/dL (ref 8–23)
CO2: 26 mmol/L (ref 22–32)
Calcium: 9.3 mg/dL (ref 8.9–10.3)
Chloride: 104 mmol/L (ref 98–111)
Creatinine: 0.87 mg/dL (ref 0.44–1.00)
GFR, Estimated: >60 mL/min (ref >60)
Glucose, Bld: 100 mg/dL — ABNORMAL HIGH (ref 70–99)
Potassium: 4.2 mmol/L (ref 3.5–5.1)
Sodium: 135 mmol/L (ref 135–145)
Total Bilirubin: 0.5 mg/dL (ref 0.0–1.2)
Total Protein: 7.2 g/dL (ref 6.5–8.1)

## 2024-05-25 LAB — CBC WITH DIFFERENTIAL (CANCER CENTER ONLY)
Abs Immature Granulocytes: 0.01 K/uL (ref 0.00–0.07)
Basophils Absolute: 0.1 K/uL (ref 0.0–0.1)
Basophils Relative: 1 %
Eosinophils Absolute: 0.3 K/uL (ref 0.0–0.5)
Eosinophils Relative: 7 %
HCT: 38.2 % (ref 36.0–46.0)
Hemoglobin: 13.1 g/dL (ref 12.0–15.0)
Immature Granulocytes: 0 %
Lymphocytes Relative: 30 %
Lymphs Abs: 1.5 K/uL (ref 0.7–4.0)
MCH: 30.3 pg (ref 26.0–34.0)
MCHC: 34.3 g/dL (ref 30.0–36.0)
MCV: 88.4 fL (ref 80.0–100.0)
Monocytes Absolute: 0.4 K/uL (ref 0.1–1.0)
Monocytes Relative: 8 %
Neutro Abs: 2.7 K/uL (ref 1.7–7.7)
Neutrophils Relative %: 54 %
Platelet Count: 205 K/uL (ref 150–400)
RBC: 4.32 MIL/uL (ref 3.87–5.11)
RDW: 12.6 % (ref 11.5–15.5)
WBC Count: 4.9 K/uL (ref 4.0–10.5)
nRBC: 0 % (ref 0.0–0.2)

## 2024-05-25 LAB — C-REACTIVE PROTEIN: CRP: 0.5 mg/dL (ref <1.0)

## 2024-05-25 LAB — SEDIMENTATION RATE: Sed Rate: 10 mm/h (ref 0–22)

## 2024-05-25 LAB — LACTATE DEHYDROGENASE: LDH: 170 U/L (ref 98–192)

## 2024-05-25 NOTE — Progress Notes (Signed)
 [facility] Telephone:([phone]   [phone]  PROGRESS NOTE  Patient Care Team: Loreli Kins, MD as PCP - General (Family Medicine) Mona Vinie BROCKS, MD as PCP - Cardiology (Cardiology) Golden Forestine BROCKS, RN as Nurse Navigator (Medical Oncology)  Hematological/Oncological History # Inguinal lymphadenopathy  Interval History:  Shannon Haney 73 y.o. female with medical history significant for inflammation of inguinal lymph nodes who presents for a follow up visit. The patient's last visit was on 11/14/2023. In the interim since the last visit she has had no changes in her health and overall has felt well.  On exam today Shannon Haney reports that she has been well in the interim since her last visit 6 months ago.  She has had no changes in her health.  She denies any fevers, chills, sweats.  She reports no palpable lymph nodes in the neck, underarms, or groin.  She reports no runny nose, sore throat, cough.  She is not having any urinary issues and her weight has been stable.  She reports that she has been having some occasional issues with tiredness and poor sleep.  She does look forward to an upcoming mission group to [place].  Otherwise she has been at her baseline level of health and has no questions concerns or complaints today.  Full 10 point ROS is otherwise negative.  MEDICAL HISTORY:  Past Medical History:  Diagnosis Date   Allergy    Depression    Hyperlipidemia    Hypertension    Seasonal allergies     SURGICAL HISTORY: Past Surgical History:  Procedure Laterality Date   AUGMENTATION MAMMAPLASTY Bilateral    replaced around 2000   BREAST ENHANCEMENT SURGERY     CHOLECYSTECTOMY     RIGHT HEART CATH Right    VAGINAL HYSTERECTOMY      SOCIAL HISTORY: Social History   Socioeconomic History   Marital status: Married    Spouse name: Not on file   Number of children: Not on file   Years of education: Not on file   Highest education  level: Not on file  Occupational History   Not on file  Tobacco Use   Smoking status: Never   Smokeless tobacco: Never  Substance and Sexual Activity   Alcohol use: Yes    Alcohol/week: 5.0 standard drinks of alcohol    Types: 3 Glasses of wine, 2 Standard drinks or equivalent per week   Drug use: No   Sexual activity: Not on file  Other Topics Concern   Not on file  Social History Narrative   Not on file   Social Drivers of Health   Financial Resource Strain: Not on file  Food Insecurity: Not on file  Transportation Needs: Not on file  Physical Activity: Not on file  Stress: Not on file  Social Connections: Not on file  Intimate Partner Violence: Not on file    FAMILY HISTORY: Family History  Problem Relation Age of Onset   Breast cancer Mother 25   Prostate cancer Father    Breast cancer Maternal Aunt 47   Breast cancer Maternal Aunt 59   Colon cancer Neg Hx    Esophageal cancer Neg Hx    Rectal cancer Neg Hx    Stomach cancer Neg Hx     ALLERGIES:  is allergic to sulfa antibiotics.  MEDICATIONS:  Current Outpatient Medications  Medication Sig Dispense Refill   Acetaminophen  (TYLENOL  EXTRA STRENGTH PO) Take 1,000 mg by mouth every 8 (eight) hours  as needed (pain).      aspirin  EC 81 MG EC tablet Take 1 tablet (81 mg total) by mouth daily.     azithromycin  (ZITHROMAX ) 250 MG tablet Take 1 tablet (250 mg total) by mouth daily. Take first 2 tablets together, then 1 every day until finished. 6 tablet 0   benzonatate  (TESSALON ) 100 MG capsule Take 1 capsule (100 mg total) by mouth every 8 (eight) hours. 21 capsule 0   calcium  carbonate (OSCAL) 1500 (600 Ca) MG TABS tablet Take by mouth 2 (two) times daily with a meal.     co-enzyme Q-10 30 MG capsule Take 30 mg by mouth daily.     diphenhydramine -acetaminophen  (TYLENOL  PM) 25-500 MG TABS Take 1 tablet by mouth at bedtime as needed (sleep).      escitalopram  (LEXAPRO ) 10 MG tablet Take 10 mg by mouth daily.      lisinopril  (PRINIVIL ,ZESTRIL ) 20 MG tablet Take 20 mg by mouth daily.      Multiple Vitamin (MULTIVITAMIN) capsule Take 1 capsule by mouth daily.     simvastatin  (ZOCOR ) 20 MG tablet Take 20 mg by mouth daily at 6 PM.      No current facility-administered medications for this visit.    REVIEW OF SYSTEMS:   Constitutional: ( - ) fevers, ( - )  chills , ( - ) night sweats Eyes: ( - ) blurriness of vision, ( - ) double vision, ( - ) watery eyes Ears, nose, mouth, throat, and face: ( - ) mucositis, ( - ) sore throat Respiratory: ( - ) cough, ( - ) dyspnea, ( - ) wheezes Cardiovascular: ( - ) palpitation, ( - ) chest discomfort, ( - ) lower extremity swelling Gastrointestinal:  ( - ) nausea, ( - ) heartburn, ( - ) change in bowel habits Skin: ( - ) abnormal skin rashes Lymphatics: ( - ) new lymphadenopathy, ( - ) easy bruising Neurological: ( - ) numbness, ( - ) tingling, ( - ) new weaknesses Behavioral/Psych: ( - ) mood change, ( - ) new changes  All other systems were reviewed with the patient and are negative.  PHYSICAL EXAMINATION: Vitals:   05/25/24 1124  BP: (!) 152/98  Pulse: 61  Resp: 13  Temp: (!) 97.2 F (36.2 C)  SpO2: 100%   Filed Weights   05/25/24 1124  Weight: 180 lb 14.4 oz (82.1 kg)    GENERAL: Well-appearing elderly Caucasian female alert, no distress and comfortable SKIN: skin color, texture, turgor are normal, no rashes or significant lesions EYES: conjunctiva are pink and non-injected, sclera clear LUNGS: clear to auscultation and percussion with normal breathing effort HEART: regular rate & rhythm and no murmurs and no lower extremity edema Musculoskeletal: no cyanosis of digits and no clubbing  PSYCH: alert & oriented x 3, fluent speech NEURO: no focal motor/sensory deficits  LABORATORY DATA:  I have reviewed the data as listed    Latest Ref Rng & Units 05/25/2024   10:41 AM 11/14/2023    6:27 AM 10/24/2023    1:28 PM  CBC  WBC 4.0 - 10.5 K/uL 4.9   4.5  5.6   Hemoglobin 12.0 - 15.0 g/dL 86.8  87.3  86.2   Hematocrit 36.0 - 46.0 % 38.2  37.0  40.0   Platelets 150 - 400 K/uL 205  199  213        Latest Ref Rng & Units 05/25/2024   10:41 AM 10/24/2023    1:28 PM 01/02/2019  9:26 AM  CMP  Glucose 70 - 99 mg/dL 899  80  889   BUN 8 - 23 mg/dL 20  14  13    Creatinine 0.44 - 1.00 mg/dL 9.12  9.22  9.30   Sodium 135 - 145 mmol/L 135  139  138   Potassium 3.5 - 5.1 mmol/L 4.2  4.1  3.8   Chloride 98 - 111 mmol/L 104  104  110   CO2 22 - 32 mmol/L 26  27  22    Calcium  8.9 - 10.3 mg/dL 9.3  9.9  8.5   Total Protein 6.5 - 8.1 g/dL 7.2  7.9    Total Bilirubin 0.0 - 1.2 mg/dL 0.5  0.4    Alkaline Phos 38 - 126 U/L 45  50    AST 15 - 41 U/L 30  25    ALT 0 - 44 U/L 34  25      RADIOGRAPHIC STUDIES: No results found.  ASSESSMENT & PLAN Shannon Haney 73 y.o. female with medical history significant for inflammation of inguinal lymph nodes who presents for a follow up visit.  # Lymphadenopathy of Inguinal Nodes -- Biopsy results consistent with a granulomatous lymphadenitis.  No evidence of underlying malignancy -- Patient has no findings suspicious for underlying inflammatory disorder and PET CT scan showed no evidence of malignancy in the body. -- Given the patient is asymptomatic would recommend following with PCP on a routine basis.  We are happy to see her back if she would develop new or worsening symptoms or worsening lymphadenopathy -- Labs today show white blood cell 4.9, hemoglobin 13.1, MCV 88.4, platelets 205 -- Return to clinic on an as-needed basis  No orders of the defined types were placed in this encounter.   All questions were answered. The patient knows to call the clinic with any problems, questions or concerns.  A total of more than 30 minutes were spent on this encounter with face-to-face time and non-face-to-face time, including preparing to see the patient, ordering tests and/or medications, counseling the  patient and coordination of care as outlined above.   Shannon IVAR Kidney, MD Department of Hematology/Oncology [facility] Phone: [phone] Pager: [phone] Email: [email or web address]  05/27/2024 8:55 PM

## 2024-05-29 DIAGNOSIS — Z7189 Other specified counseling: Secondary | ICD-10-CM | POA: Diagnosis not present

## 2024-05-29 DIAGNOSIS — Z1211 Encounter for screening for malignant neoplasm of colon: Secondary | ICD-10-CM | POA: Diagnosis not present

## 2024-05-29 DIAGNOSIS — G2581 Restless legs syndrome: Secondary | ICD-10-CM | POA: Diagnosis not present

## 2024-05-29 DIAGNOSIS — G4700 Insomnia, unspecified: Secondary | ICD-10-CM | POA: Diagnosis not present

## 2024-06-13 NOTE — Progress Notes (Unsigned)
 Ellouise Console, PA-C [address] [place], [place]  [address] Phone: [phone]   Gastroenterology Consultation  Referring Provider:     Loreli Kins, MD Primary Care Physician:  Loreli Kins, MD Primary Gastroenterologist:  Ellouise Console, PA-C / Dr. Gordy Starch   Reason for Consultation:  History of colon polyps, change in Bowel Habits, Discuss repeat Colonoscopy        HPI:   Shannon Haney is a 73 y.o. y/o female referred for consultation & management  by Loreli Kins, MD.    07/2019 last colonoscopy by Dr. Starch: 3 tubular adenoma polyps removed (4 mm to 8 mm).  Good prep.  3-year repeat colonoscopy was recommended, which is overdue.  06/2014 colonoscopy:  One small 4 mm tubular adenoma polyp removed.  Current symptoms: Patient states she had an episode of severe diarrhea which started acutely November 2024.  Diarrhea lasted 6 weeks and gradually resolved.  Stool studies showed Norovirus.  All other pathogens negative.  She was having right lower quadrant pain.  Abdominal pelvic CT showed 09/2023 swollen lymph nodes in the right lower abdomen.  She was referred to oncology and underwent thorough evaluation.  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 No evidence of underlying malignancy.  Last follow-up with Dr. Federico oncologist 05/25/2024.  Labs showed white blood cell 4.9, hemoglobin 13.1, MCV 88.4, platelets 205.  Doing well.    She is currently feeling a lot better, however her stools never returned to normal.  Ever since the episode she has had bowel irregularities with formed, loose, and watery stools.  She has anywhere from 1-4 bowel movements per day.  Only had 1 episode of constipation in the past 4 months.  Still has occasional mild intermittent RLQ pain which comes and goes.  She has followed up with her PCP and was started on probiotic with mild benefit.  She still has some abdominal bloating.  She denies any rectal bleeding  or weight loss.  No anemia.  She would like to proceed with colonoscopy given her history of adenomatous colon polyps.  Past Medical History:  Diagnosis Date   Allergy    Depression    Hyperlipidemia    Hypertension    Seasonal allergies     Past Surgical History:  Procedure Laterality Date   AUGMENTATION MAMMAPLASTY Bilateral    replaced around 2000   BREAST ENHANCEMENT SURGERY     CHOLECYSTECTOMY     RIGHT HEART CATH Right    VAGINAL HYSTERECTOMY      Prior to Admission medications   Medication Sig Start Date End Date Taking? Authorizing Provider  Acetaminophen  (TYLENOL  EXTRA STRENGTH PO) Take 1,000 mg by mouth every 8 (eight) hours as needed (pain).     [provider]  aspirin  EC 81 MG EC tablet Take 1 tablet (81 mg total) by mouth daily. 01/03/19   Claudene Maximino LABOR, MD  azithromycin  (ZITHROMAX ) 250 MG tablet Take 1 tablet (250 mg total) by mouth daily. Take first 2 tablets together, then 1 every day until finished. 05/29/22   Towana Ozell BROCKS, MD  benzonatate  (TESSALON ) 100 MG capsule Take 1 capsule (100 mg total) by mouth every 8 (eight) hours. 05/29/22   Towana Ozell BROCKS, MD  calcium  carbonate (OSCAL) 1500 (600 Ca) MG TABS tablet Take by mouth 2 (two) times daily with a meal.    [provider]  co-enzyme Q-10 30 MG capsule Take 30 mg by mouth daily.  [provider]  diphenhydramine -acetaminophen  (TYLENOL  PM) 25-500 MG TABS Take 1 tablet by mouth at bedtime as needed (sleep).     [provider]  escitalopram  (LEXAPRO ) 10 MG tablet Take 10 mg by mouth daily.    [provider]  lisinopril  (PRINIVIL ,ZESTRIL ) 20 MG tablet Take 20 mg by mouth daily.     [provider]  Multiple Vitamin (MULTIVITAMIN) capsule Take 1 capsule by mouth daily.    [provider]  simvastatin  (ZOCOR ) 20 MG tablet Take 20 mg by mouth daily at 6 PM.     [provider]    Family History  Problem Relation Age of Onset   Breast  cancer Mother 29   Prostate cancer Father    Breast cancer Maternal Aunt 30   Breast cancer Maternal Aunt 70   Colon cancer Neg Hx    Esophageal cancer Neg Hx    Rectal cancer Neg Hx    Stomach cancer Neg Hx      Social History   Tobacco Use   Smoking status: Never   Smokeless tobacco: Never  Substance Use Topics   Alcohol use: Yes    Alcohol/week: 5.0 standard drinks of alcohol    Types: 3 Glasses of wine, 2 Standard drinks or equivalent per week   Drug use: No    Allergies as of 06/14/2024 - Review Complete 06/14/2024  Allergen Reaction Noted   Sulfa antibiotics Rash 05/27/2014    Review of Systems:    All systems reviewed and negative except where noted in HPI.   Physical Exam:  BP 130/70 (Cuff Size: Normal)   Pulse 69   Ht 5' 6.93 (1.7 m) Comment: height without shoes  Wt 180 lb 6 oz (81.8 kg)   BMI 28.31 kg/m  No LMP recorded. Patient has had a hysterectomy.  General:   Alert,  Well-developed, well-nourished, pleasant and cooperative in NAD Lungs:  Respirations even and unlabored.  Clear throughout to auscultation.   No wheezes, crackles, or rhonchi. No acute distress. Heart:  Regular rate and rhythm; no murmurs, clicks, rubs, or gallops. Abdomen:  Normal bowel sounds.  No bruits.  Soft, and non-distended without masses, hepatosplenomegaly or hernias noted.  No Tenderness.  No guarding or rebound tenderness.    Neurologic:  Alert and oriented x3;  grossly normal neurologically. Psych:  Alert and cooperative. Normal mood and affect.  Imaging Studies:  10/07/2023 abdominal pelvic CT with contrast: 1. Multiple abnormally enlarged right external iliac chain lymph nodes measuring up to 1.7 cm on short axis. Mildly enlarged right pelvic sidewall lymph node also seen. Findings are suspicious for metastatic disease versus lymphoma/leukemia. Further evaluation with PET scan would be beneficial. Tissue sampling can also be considered. 2. No additional acute  abnormality of the abdomen or pelvis.  10/20/2023 nuclear medicine PET whole-body scan: 1. Hypermetabolic pelvic and lower retroperitoneal lymph nodes, with index right external iliac node measuring 1.7 cm in short axis with maximum SUV of 14.4. 2. Hypermetabolic left axillary lymph node measuring 0.8 cm in short axis with maximum SUV of 8.5. 3. An obvious primary site is not identified, reason the possibility of lymphoproliferative disease. 4. Tonsillar activity, maximum SUV 8.1 on the right and 8.0 on the left, likely physiologic but technically nonspecific. 5. Small bilateral knee effusions. 6. Mild mosaic attenuation in the lungs, nonspecific. 7. Aortic atherosclerosis.  10/2023: Right Iliac Lymph Node Biopsy: - Granulomatous lymphadenitis.  Labs: CBC    Component Value Date/Time   WBC 4.9  05/25/2024 1041   WBC 4.5 11/14/2023 0627   RBC 4.32 05/25/2024 1041   HGB 13.1 05/25/2024 1041   HCT 38.2 05/25/2024 1041   PLT 205 05/25/2024 1041   MCV 88.4 05/25/2024 1041    CMP     Component Value Date/Time   NA 135 05/25/2024 1041   K 4.2 05/25/2024 1041   CL 104 05/25/2024 1041   CO2 26 05/25/2024 1041   GLUCOSE 100 (H) 05/25/2024 1041   BUN 20 05/25/2024 1041   CREATININE 0.87 05/25/2024 1041   CALCIUM  9.3 05/25/2024 1041   PROT 7.2 05/25/2024 1041   ALBUMIN 4.4 05/25/2024 1041   AST 30 05/25/2024 1041   ALT 34 05/25/2024 1041   ALKPHOS 45 05/25/2024 1041   BILITOT 0.5 05/25/2024 1041   GFRNONAA >60 05/25/2024 1041   GFRAA >60 01/02/2019 0926    Assessment and Plan:   LILLIONA BLAKENEY is a 73 y.o. y/o female has been referred for:  1.  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  Schedule updated colonoscopy. - Patient education given regarding IBS. - Start OTC align probiotic once daily for 1 month. - Start Benefiber powder 1 or 2 tablespoons in a drink once daily. -  Consume Foods with Natural Probiotics:  Kefir Yogurt, Any Yogurt, Sour Krout, Pickles, Miso Soup, Ferminted foods.  2.  History of adenomatous colon polyps - Scheduling Colonoscopy I discussed risks of colonoscopy with patient to include risk of bleeding, colon perforation, and risk of sedation.  Patient expressed understanding and agrees to proceed with colonoscopy.   Follow up as needed based on colonoscopy results and GI symptoms.  Ellouise Console, PA-C

## 2024-06-14 ENCOUNTER — Ambulatory Visit: Admitting: Physician Assistant

## 2024-06-14 ENCOUNTER — Encounter: Payer: Self-pay | Admitting: Physician Assistant

## 2024-06-14 VITALS — BP 130/70 | HR 69 | Ht 66.93 in | Wt 180.4 lb

## 2024-06-14 DIAGNOSIS — Z860101 Personal history of adenomatous and serrated colon polyps: Secondary | ICD-10-CM

## 2024-06-14 DIAGNOSIS — Z860100 Personal history of colon polyps, unspecified: Secondary | ICD-10-CM

## 2024-06-14 DIAGNOSIS — R194 Change in bowel habit: Secondary | ICD-10-CM

## 2024-06-14 DIAGNOSIS — R198 Other specified symptoms and signs involving the digestive system and abdomen: Secondary | ICD-10-CM

## 2024-06-14 MED ORDER — NA SULFATE-K SULFATE-MG SULF 17.5-3.13-1.6 GM/177ML PO SOLN: 1.0000 | Freq: Once | ORAL | 0 refills | Status: AC

## 2024-06-14 NOTE — Patient Instructions (Addendum)
 Foods with Natural Probiotics:  Kefir Yogurt, Any Yogurt, Sour Krout, Pickles, Miso Soup, Ferminted foods.     Start OTC Benefiber Powder. Mix 1 - 2 Tablespoons in 6 - 8 ounces of a Drink Once Daily. Drink 64 ounces of water / fluids Daily.     Take 1 Capsule Once Daily for 30 days. If GI symptoms improve, then OK to continue Align. If GI symptoms do not improve after 30 days, then discontinue.   Please follow up sooner if symptoms increase or worsen  Due to recent changes in healthcare laws, you may see the results of your imaging and laboratory studies on MyChart before your provider has had a chance to review them.  We understand that in some cases there may be results that are confusing or concerning to you. Not all laboratory results come back in the same time frame and the provider may be waiting for multiple results in order to interpret others.  Please give us  48 hours in order for your provider to thoroughly review all the results before contacting the office for clarification of your results.   Thank you for trusting me with your gastrointestinal care!   Ellouise Console, PA-C _______________________________________________________  If your blood pressure at your visit was 140/90 or greater, please contact your primary care physician to follow up on this.  _______________________________________________________  If you are age 74 or older, your body mass index should be between 23-30. Your Body mass index is 28.31 kg/m. If this is out of the aforementioned range listed, please consider follow up with your Primary Care Provider.  If you are age 4 or younger, your body mass index should be between 19-25. Your Body mass index is 28.31 kg/m. If this is out of the aformentioned range listed, please consider follow up with your Primary Care Provider.   ________________________________________________________  The [facility] providers would like to encourage you to use MYCHART to  communicate with providers for non-urgent requests or questions.  Due to long hold times on the telephone, sending your provider a message by [facility] may be a faster and more efficient way to get a response.  Please allow 48 business hours for a response.  Please remember that this is for non-urgent requests.  _______________________________________________________

## 2024-06-29 ENCOUNTER — Encounter: Payer: Self-pay | Admitting: Internal Medicine

## 2024-06-29 ENCOUNTER — Ambulatory Visit: Admitting: Internal Medicine

## 2024-06-29 VITALS — BP 133/73 | HR 60 | Temp 97.4°F | Resp 13 | Ht 66.0 in | Wt 180.0 lb

## 2024-06-29 DIAGNOSIS — K5730 Diverticulosis of large intestine without perforation or abscess without bleeding: Secondary | ICD-10-CM

## 2024-06-29 DIAGNOSIS — E785 Hyperlipidemia, unspecified: Secondary | ICD-10-CM | POA: Diagnosis not present

## 2024-06-29 DIAGNOSIS — Z1211 Encounter for screening for malignant neoplasm of colon: Secondary | ICD-10-CM

## 2024-06-29 DIAGNOSIS — F32A Depression, unspecified: Secondary | ICD-10-CM | POA: Diagnosis not present

## 2024-06-29 DIAGNOSIS — Z860100 Personal history of colon polyps, unspecified: Secondary | ICD-10-CM

## 2024-06-29 DIAGNOSIS — D123 Benign neoplasm of transverse colon: Secondary | ICD-10-CM

## 2024-06-29 DIAGNOSIS — Z860101 Personal history of adenomatous and serrated colon polyps: Secondary | ICD-10-CM

## 2024-06-29 DIAGNOSIS — I10 Essential (primary) hypertension: Secondary | ICD-10-CM | POA: Diagnosis not present

## 2024-06-29 MED ORDER — SODIUM CHLORIDE 0.9 % IV SOLN: 500.0000 mL | Freq: Once | INTRAVENOUS | Status: DC

## 2024-06-29 NOTE — Progress Notes (Signed)
 PT taken to PACU. Monitors in place. VSS. Report given to RN.

## 2024-06-29 NOTE — Progress Notes (Signed)
 GASTROENTEROLOGY PROCEDURE H&P NOTE   Primary Care Physician: Loreli Kins, MD    Reason for Procedure:  History of colon polyps  Plan:    Surveillance colonoscopy  Patient is appropriate for endoscopic procedure(s) in the ambulatory ([facility]) setting.  The nature of the procedure, as well as the risks, benefits, and alternatives were carefully and thoroughly reviewed with the patient. Ample time for discussion and questions allowed. The patient understood, was satisfied, and agreed to proceed.     HPI: Shannon Haney is a 73 y.o. female who presents for surveillance colonoscopy.  Medical history as below.  Tolerated the prep.  No recent chest pain or shortness of breath.  No abdominal pain today.  Past Medical History:  Diagnosis Date   Allergy    Depression    Hyperlipidemia    Hypertension    Seasonal allergies     Past Surgical History:  Procedure Laterality Date   AUGMENTATION MAMMAPLASTY Bilateral    replaced around 2000   BREAST ENHANCEMENT SURGERY     CHOLECYSTECTOMY     RIGHT HEART CATH Right    VAGINAL HYSTERECTOMY      Prior to Admission medications   Medication Sig Start Date End Date Taking? Authorizing Provider  Calcium  Carb-Vit D-Soy Isoflav (CALTRATE 600 + SOY PO) Caltrate 600   Yes [provider]  calcium  carbonate (OSCAL) 1500 (600 Ca) MG TABS tablet Take by mouth 2 (two) times daily with a meal.   Yes [provider]  cetirizine (ZYRTEC ALLERGY) 10 MG tablet 1 tablet Orally Once a day   Yes [provider]  co-enzyme Q-10 30 MG capsule Take 30 mg by mouth daily.   Yes [provider]  escitalopram  (LEXAPRO ) 10 MG tablet Take 10 mg by mouth daily.   Yes [provider]  lisinopril  (PRINIVIL ,ZESTRIL ) 20 MG tablet Take 20 mg by mouth daily.    Yes [provider]  Multiple Vitamin (MULTIVITAMIN) capsule Take 1 capsule by mouth daily.   Yes [provider]  simvastatin  (ZOCOR ) 20 MG  tablet Take 20 mg by mouth daily at 6 PM.    Yes [provider]  traZODone (DESYREL) 50 MG tablet 1-2 tablet at bedtime as needed Orally Once a day; Duration: 30 days 05/29/24  Yes [provider]  Acetaminophen  (TYLENOL  EXTRA STRENGTH PO) Take 1,000 mg by mouth every 8 (eight) hours as needed (pain).     [provider]  rOPINIRole (REQUIP) 0.25 MG tablet Take by mouth as needed.    [provider]  valACYclovir (VALTREX) 1000 MG tablet 2 tablets Orally twice a day for 24hrs at first sign of cold sore; Duration: 90 days As needed 01/16/21   [provider]    Current Outpatient Medications  Medication Sig Dispense Refill   Calcium  Carb-Vit D-Soy Isoflav (CALTRATE 600 + SOY PO) Caltrate 600     calcium  carbonate (OSCAL) 1500 (600 Ca) MG TABS tablet Take by mouth 2 (two) times daily with a meal.     cetirizine (ZYRTEC ALLERGY) 10 MG tablet 1 tablet Orally Once a day     co-enzyme Q-10 30 MG capsule Take 30 mg by mouth daily.     escitalopram  (LEXAPRO ) 10 MG tablet Take 10 mg by mouth daily.     lisinopril  (PRINIVIL ,ZESTRIL ) 20 MG tablet Take 20 mg by mouth daily.      Multiple Vitamin (MULTIVITAMIN) capsule Take 1 capsule by mouth daily.     simvastatin  (ZOCOR ) 20  MG tablet Take 20 mg by mouth daily at 6 PM.      traZODone (DESYREL) 50 MG tablet 1-2 tablet at bedtime as needed Orally Once a day; Duration: 30 days     Acetaminophen  (TYLENOL  EXTRA STRENGTH PO) Take 1,000 mg by mouth every 8 (eight) hours as needed (pain).      rOPINIRole (REQUIP) 0.25 MG tablet Take by mouth as needed.     valACYclovir (VALTREX) 1000 MG tablet 2 tablets Orally twice a day for 24hrs at first sign of cold sore; Duration: 90 days As needed     Current Facility-Administered Medications  Medication Dose Route Frequency Provider Last Rate Last Admin   0.9 %  sodium chloride  infusion  500 mL Intravenous Once Giani Winther, Gordy HERO, MD        Allergies as of 06/29/2024 - Review  Complete 06/29/2024  Allergen Reaction Noted   Sulfa antibiotics Rash 05/27/2014    Family History  Problem Relation Age of Onset   Breast cancer Mother 68   Prostate cancer Father    Breast cancer Maternal Aunt 86   Breast cancer Maternal Aunt 70   Colon cancer Neg Hx    Esophageal cancer Neg Hx    Rectal cancer Neg Hx    Stomach cancer Neg Hx     Social History   Socioeconomic History   Marital status: Married    Spouse name: Not on file   Number of children: Not on file   Years of education: Not on file   Highest education level: Not on file  Occupational History   Not on file  Tobacco Use   Smoking status: Never   Smokeless tobacco: Never  Substance and Sexual Activity   Alcohol use: Yes    Alcohol/week: 5.0 standard drinks of alcohol    Types: 3 Glasses of wine, 2 Standard drinks or equivalent per week    Comment: social   Drug use: No   Sexual activity: Not on file  Other Topics Concern   Not on file  Social History Narrative   Not on file   Social Drivers of Health   Financial Resource Strain: Not on file  Food Insecurity: Not on file  Transportation Needs: Not on file  Physical Activity: Not on file  Stress: Not on file  Social Connections: Not on file  Intimate Partner Violence: Not on file    Physical Exam: Vital signs in last 24 hours: @BP  111/65   Pulse 68   Temp (!) 97.4 F (36.3 C)   Ht 5' 6 (1.676 m)   Wt 180 lb (81.6 kg)   SpO2 96%   BMI 29.05 kg/m  GEN: NAD EYE: Sclerae anicteric ENT: MMM CV: Non-tachycardic Pulm: CTA b/l GI: Soft, NT/ND NEURO:  Alert & Oriented x 3   Gordy Starch, MD [facility]  06/29/2024 8:28 AM

## 2024-06-29 NOTE — Patient Instructions (Signed)

## 2024-06-29 NOTE — Progress Notes (Signed)
 Pt's states no medical or surgical changes since previsit or office visit.

## 2024-06-29 NOTE — Op Note (Signed)
 [facility] Patient Name: Shannon Haney Procedure Date: 06/29/2024 8:22 AM MRN: [id number] Endoscopist: Gordy CHRISTELLA Starch , MD, [id number] Age: 73 Referring MD:  Date of Birth: 02/19/1951 Gender: Female Account #: [id number] Procedure:                Colonoscopy Indications:              High risk colon cancer surveillance: Personal                            history of multiple adenomas, Last colonoscopy:                            October 2020 (TA x 3), Sept 2015 (TA x 1) Medicines:                Monitored Anesthesia Care Procedure:                Pre-Anesthesia Assessment:                           - Prior to the procedure, a History and Physical                            was performed, and patient medications and                            allergies were reviewed. The patient's tolerance of                            previous anesthesia was also reviewed. The risks                            and benefits of the procedure and the sedation                            options and risks were discussed with the patient.                            All questions were answered, and informed consent                            was obtained. Prior Anticoagulants: The patient has                            taken no anticoagulant or antiplatelet agents. ASA                            Grade Assessment: II - A patient with mild systemic                            disease. After reviewing the risks and benefits,                            the patient was deemed in satisfactory condition to  undergo the procedure.                           After obtaining informed consent, the colonoscope                            was passed under direct vision. Throughout the                            procedure, the patient's blood pressure, pulse, and                            oxygen saturations were monitored continuously. The                            Olympus Scope  [id number] was introduced through the                            anus and advanced to the cecum, identified by                            appendiceal orifice and ileocecal valve. The                            colonoscopy was performed without difficulty. The                            patient tolerated the procedure well. The quality                            of the bowel preparation was good. The ileocecal                            valve, appendiceal orifice, and rectum were                            photographed. Scope In: 8:42:05 AM Scope Out: 9:06:32 AM Scope Withdrawal Time: 0 hours 20 minutes 58 seconds  Total Procedure Duration: 0 hours 24 minutes 27 seconds  Findings:                 The digital rectal exam was normal.                           A 3 mm polyp was found in the proximal transverse                            colon. The polyp was sessile. The polyp was removed                            with a cold snare. Resection and retrieval were                            complete.  A 15 mm polyp was found in the distal transverse                            colon. The polyp was flat. The polyp was removed                            with a piecemeal technique using a cold snare.                            Resection and retrieval were complete. Area distal                            to resection site was tattooed with an injection of                            3 mL of Spot (carbon black).                           Multiple medium-mouthed and small-mouthed                            diverticula were found in the sigmoid colon.                           The retroflexed view of the distal rectum and anal                            verge was normal and showed no anal or rectal                            abnormalities. Complications:            No immediate complications. Estimated Blood Loss:     Estimated blood loss was minimal. Impression:               -  One 3 mm polyp in the proximal transverse colon,                            removed with a cold snare. Resected and retrieved.                           - One 15 mm polyp in the distal transverse colon,                            removed piecemeal using a cold snare. Resected and                            retrieved. Tattooed distal on same wall.                           - Mild diverticulosis in the sigmoid colon.                           - The  distal rectum and anal verge are normal on                            retroflexion view. Recommendation:           - Patient has a contact number available for                            emergencies. The signs and symptoms of potential                            delayed complications were discussed with the                            patient. Return to normal activities tomorrow.                            Written discharge instructions were provided to the                            patient.                           - Resume previous diet.                           - Continue present medications.                           - Await pathology results.                           - Repeat colonoscopy is recommended for                            surveillance after piecemeal polypectomy. The                            colonoscopy date will be determined after pathology                            results from today's exam become available for                            review. Gordy CHRISTELLA Starch, MD 06/29/2024 9:18:20 AM This report has been signed electronically.

## 2024-07-02 ENCOUNTER — Telehealth: Payer: Self-pay | Admitting: *Deleted

## 2024-07-02 NOTE — Telephone Encounter (Signed)
  Follow up Call-     06/29/2024    8:01 AM  Call back number  Post procedure Call Back phone  # [phone]  Permission to leave phone message Yes   Left message to call back if any questions or concerns

## 2024-07-03 LAB — SURGICAL PATHOLOGY

## 2024-07-05 ENCOUNTER — Ambulatory Visit: Payer: Self-pay | Admitting: Internal Medicine

## 2024-08-23 ENCOUNTER — Other Ambulatory Visit: Payer: Self-pay | Admitting: Medical Genetics

## 2024-08-23 DIAGNOSIS — Z006 Encounter for examination for normal comparison and control in clinical research program: Secondary | ICD-10-CM

## 2024-08-29 DIAGNOSIS — N182 Chronic kidney disease, stage 2 (mild): Secondary | ICD-10-CM | POA: Diagnosis not present

## 2024-08-29 DIAGNOSIS — Z0000 Encounter for general adult medical examination without abnormal findings: Secondary | ICD-10-CM | POA: Diagnosis not present

## 2024-08-29 DIAGNOSIS — Z8249 Family history of ischemic heart disease and other diseases of the circulatory system: Secondary | ICD-10-CM | POA: Diagnosis not present

## 2024-08-29 DIAGNOSIS — F3340 Major depressive disorder, recurrent, in remission, unspecified: Secondary | ICD-10-CM | POA: Diagnosis not present

## 2024-08-29 DIAGNOSIS — G2581 Restless legs syndrome: Secondary | ICD-10-CM | POA: Diagnosis not present

## 2024-08-29 DIAGNOSIS — E782 Mixed hyperlipidemia: Secondary | ICD-10-CM | POA: Diagnosis not present

## 2024-08-29 DIAGNOSIS — G4733 Obstructive sleep apnea (adult) (pediatric): Secondary | ICD-10-CM | POA: Diagnosis not present

## 2024-08-29 DIAGNOSIS — I129 Hypertensive chronic kidney disease with stage 1 through stage 4 chronic kidney disease, or unspecified chronic kidney disease: Secondary | ICD-10-CM | POA: Diagnosis not present

## 2024-08-29 DIAGNOSIS — Z131 Encounter for screening for diabetes mellitus: Secondary | ICD-10-CM | POA: Diagnosis not present

## 2024-08-29 DIAGNOSIS — E039 Hypothyroidism, unspecified: Secondary | ICD-10-CM | POA: Diagnosis not present

## 2024-09-17 DIAGNOSIS — H2513 Age-related nuclear cataract, bilateral: Secondary | ICD-10-CM | POA: Diagnosis not present

## 2024-09-17 DIAGNOSIS — H25013 Cortical age-related cataract, bilateral: Secondary | ICD-10-CM | POA: Diagnosis not present

## 2024-09-17 DIAGNOSIS — H5203 Hypermetropia, bilateral: Secondary | ICD-10-CM | POA: Diagnosis not present

## 2024-12-24 ENCOUNTER — Encounter

## 2025-01-07 ENCOUNTER — Encounter: Admitting: Internal Medicine
